# Patient Record
Sex: Female | Born: 1955
Health system: Southern US, Community
[De-identification: ages and names within clinical notes are randomized; demographics above are authoritative.]

## PROBLEM LIST (undated history)

## (undated) DIAGNOSIS — I10 Essential (primary) hypertension: Secondary | ICD-10-CM

## (undated) DIAGNOSIS — Z789 Other specified health status: Secondary | ICD-10-CM

## (undated) DIAGNOSIS — M1990 Unspecified osteoarthritis, unspecified site: Secondary | ICD-10-CM

---

## 2015-06-14 ENCOUNTER — Other Ambulatory Visit: Payer: Self-pay | Admitting: Physician Assistant

## 2015-06-21 NOTE — Patient Instructions (Addendum)
YOUR PROCEDURE IS SCHEDULED ON :  06/23/15  REPORT TO [name] MAIN ENTRANCE FOLLOW SIGNS TO EAST ELEVATOR - GO TO 3rd FLOOR CHECK IN AT 3 EAST NURSES STATION (SHORT STAY) AT: 5:30 AM  CALL THIS NUMBER IF YOU HAVE PROBLEMS THE MORNING OF SURGERY [phone]  REMEMBER:ONLY 1 PER PERSON MAY GO TO SHORT STAY WITH YOU TO GET READY THE MORNING OF YOUR SURGERY  DO NOT EAT FOOD OR DRINK LIQUIDS AFTER MIDNIGHT  TAKE THESE MEDICINES THE MORNING OF SURGERY: NONE  YOU MAY NOT HAVE ANY METAL ON YOUR BODY INCLUDING HAIR PINS AND PIERCING'S. DO NOT WEAR JEWELRY, MAKEUP, LOTIONS, POWDERS OR PERFUMES. DO NOT WEAR NAIL POLISH. DO NOT SHAVE 48 HRS PRIOR TO SURGERY. MEN MAY SHAVE FACE AND NECK.  DO NOT BRING VALUABLES TO HOSPITAL. [name] IS NOT RESPONSIBLE FOR VALUABLES.  CONTACTS, DENTURES OR PARTIALS MAY NOT BE WORN TO SURGERY. LEAVE SUITCASE IN CAR. CAN BE BROUGHT TO ROOM AFTER SURGERY.  PATIENTS DISCHARGED THE DAY OF SURGERY WILL NOT BE ALLOWED TO DRIVE HOME.  PLEASE READ OVER THE FOLLOWING INSTRUCTION SHEETS _________________________________________________________________________________                                          [name] - PREPARING FOR SURGERY  Before surgery, you can play an important role.  Because skin is not sterile, your skin needs to be as free of germs as possible.  You can reduce the number of germs on your skin by washing with CHG (chlorahexidine gluconate) soap before surgery.  CHG is an antiseptic cleaner which kills germs and bonds with the skin to continue killing germs even after washing. Please DO NOT use if you have an allergy to CHG or antibacterial soaps.  If your skin becomes reddened/irritated stop using the CHG and inform your nurse when you arrive at Short Stay. Do not shave (including legs and underarms) for at least 48 hours prior to the first CHG shower.  You may shave your face. Please follow these instructions  carefully:   1.  Shower with CHG Soap the night before surgery and the  morning of Surgery.   2.  If you choose to wash your hair, wash your hair first as usual with your  normal  Shampoo.   3.  After you shampoo, rinse your hair and body thoroughly to remove the  shampoo.                                         4.  Use CHG as you would any other liquid soap.  You can apply chg directly  to the skin and wash . Gently wash with scrungie or clean wascloth    5.  Apply the CHG Soap to your body ONLY FROM THE NECK DOWN.   Do not use on open                           Wound or open sores. Avoid contact with eyes, ears mouth and genitals (private parts).                        Genitals (private parts) with your normal soap.  6.  Wash thoroughly, paying special attention to the area where your surgery  will be performed.   7.  Thoroughly rinse your body with warm water from the neck down.   8.  DO NOT shower/wash with your normal soap after using and rinsing off  the CHG Soap .                9.  Pat yourself dry with a clean towel.             10.  Wear clean night clothes to bed after shower             11.  Place clean sheets on your bed the night of your first shower and do not  sleep with pets.  Day of Surgery : Do not apply any lotions/deodorants the morning of surgery.  Please wear clean clothes to the hospital/surgery [name].  FAILURE TO FOLLOW THESE INSTRUCTIONS MAY RESULT IN THE CANCELLATION OF YOUR SURGERY    PATIENT SIGNATURE_________________________________  ______________________________________________________________________     Nancy [name]  An incentive spirometer is a tool that can help keep your lungs clear and active. This tool measures how well you are filling your lungs with each breath. Taking long deep breaths may help reverse or decrease the chance of developing breathing (pulmonary) problems (especially infection) following:  A long  period of time when you are unable to move or be active. BEFORE THE PROCEDURE   If the spirometer includes an indicator to show your best effort, your nurse or respiratory therapist will set it to a desired goal.  If possible, sit up straight or lean slightly forward. Try not to slouch.  Hold the incentive spirometer in an upright position. INSTRUCTIONS FOR USE  1. Sit on the edge of your bed if possible, or sit up as far as you can in bed or on a chair. 2. Hold the incentive spirometer in an upright position. 3. Breathe out normally. 4. Place the mouthpiece in your mouth and seal your lips tightly around it. 5. Breathe in slowly and as deeply as possible, raising the piston or the ball toward the top of the column. 6. Hold your breath for 3-5 seconds or for as long as possible. Allow the piston or ball to fall to the bottom of the column. 7. Remove the mouthpiece from your mouth and breathe out normally. 8. Rest for a few seconds and repeat Steps 1 through 7 at least 10 times every 1-2 hours when you are awake. Take your time and take a few normal breaths between deep breaths. 9. The spirometer may include an indicator to show your best effort. Use the indicator as a goal to work toward during each repetition. 10. After each set of 10 deep breaths, practice coughing to be sure your lungs are clear. If you have an incision (the cut made at the time of surgery), support your incision when coughing by placing a pillow or rolled up towels firmly against it. Once you are able to get out of bed, walk around indoors and cough well. You may stop using the incentive spirometer when instructed by your caregiver.  RISKS AND COMPLICATIONS  Take your time so you do not get dizzy or light-headed.  If you are in pain, you may need to take or ask for pain medication before doing incentive spirometry. It is harder to take a deep breath if you are having pain. AFTER USE  Rest and breathe slowly and  easily.  It can be helpful to keep track of a log of your progress. Your caregiver can provide you with a simple table to help with this. If you are using the spirometer at home, follow these instructions: [place] IF:   You are having difficultly using the spirometer.  You have trouble using the spirometer as often as instructed.  Your pain medication is not giving enough relief while using the spirometer.  You develop fever of 100.5 F (38.1 C) or higher. SEEK IMMEDIATE MEDICAL CARE IF:   You cough up bloody sputum that had not been present before.  You develop fever of 102 F (38.9 C) or greater.  You develop worsening pain at or near the incision site. MAKE SURE YOU:   Understand these instructions.  Will watch your condition.  Will get help right away if you are not doing well or get worse. Document Released: 08/05/2006 Document Revised: 06/17/2011 Document Reviewed: 10/06/2006 [facility] Patient Information 2014 [place], [place].   ________________________________________________________________________

## 2015-06-22 ENCOUNTER — Encounter (HOSPITAL_COMMUNITY): Payer: Self-pay

## 2015-06-22 ENCOUNTER — Encounter (HOSPITAL_COMMUNITY)
Admission: RE | Admit: 2015-06-22 | Discharge: 2015-06-22 | Disposition: A | Discharge status: Home / Self Care | Payer: BLUE CROSS/BLUE SHIELD | Source: Ambulatory Visit | Attending: Orthopaedic Surgery | Admitting: Orthopaedic Surgery

## 2015-06-22 HISTORY — DX: Unspecified osteoarthritis, unspecified site: M19.90

## 2015-06-22 LAB — PROTIME-INR
INR: 1.12 (ref 0.00–1.49)
PROTHROMBIN TIME: 14.6 s (ref 11.6–15.2)

## 2015-06-22 LAB — CBC
HEMATOCRIT: 44.5 % (ref 36.0–46.0)
Hemoglobin: 14.3 g/dL (ref 12.0–15.0)
MCH: 29.5 pg (ref 26.0–34.0)
MCHC: 32.1 g/dL (ref 30.0–36.0)
MCV: 91.9 fL (ref 78.0–100.0)
PLATELETS: 264 10*3/uL (ref 150–400)
RBC: 4.84 MIL/uL (ref 3.87–5.11)
RDW: 13.8 % (ref 11.5–15.5)
WBC: 8.9 10*3/uL (ref 4.0–10.5)

## 2015-06-22 LAB — BASIC METABOLIC PANEL
Anion gap: 12 (ref 5–15)
BUN: 15 mg/dL (ref 6–20)
CALCIUM: 9.3 mg/dL (ref 8.9–10.3)
CO2: 24 mmol/L (ref 22–32)
CREATININE: 0.78 mg/dL (ref 0.44–1.00)
Chloride: 106 mmol/L (ref 101–111)
GFR calc non Af Amer: >60 mL/min (ref >60)
Glucose, Bld: 107 mg/dL — ABNORMAL HIGH (ref 65–99)
Potassium: 4.1 mmol/L (ref 3.5–5.1)
SODIUM: 142 mmol/L (ref 135–145)

## 2015-06-22 LAB — SURGICAL PCR SCREEN
MRSA, PCR: NEGATIVE
STAPHYLOCOCCUS AUREUS: POSITIVE — AB

## 2015-06-22 LAB — ABO/RH: ABO/RH(D): O POS

## 2015-06-22 NOTE — Anesthesia Preprocedure Evaluation (Signed)
Anesthesia Evaluation  Patient identified by MRN, date of birth, ID band Patient awake    Reviewed: Allergy & Precautions, NPO status , Patient's Chart, lab work & pertinent test results  History of Anesthesia Complications Negative for: history of anesthetic complications  Airway Mallampati: III  TM Distance: >3 FB Neck ROM: Full    Dental no notable dental hx. (+) Dental Advisory Given   Pulmonary neg pulmonary ROS,    Pulmonary exam normal breath sounds clear to auscultation       Cardiovascular negative cardio ROS Normal cardiovascular exam Rhythm:Regular Rate:Normal     Neuro/Psych negative neurological ROS  negative psych ROS   GI/Hepatic negative GI ROS, Neg liver ROS,   Endo/Other  Morbid obesity  Renal/GU negative Renal ROS  negative genitourinary   Musculoskeletal  (+) Arthritis ,   Abdominal (+) + obese,   Peds negative pediatric ROS (+)  Hematology negative hematology ROS (+)   Anesthesia Other Findings   Reproductive/Obstetrics negative OB ROS                             Anesthesia Physical Anesthesia Plan  ASA: III  Anesthesia Plan: Spinal   Post-op Pain Management:    Induction:   Airway Management Planned:   Additional Equipment:   Intra-op Plan:   Post-operative Plan:   Informed Consent: I have reviewed the patients History and Physical, chart, labs and discussed the procedure including the risks, benefits and alternatives for the proposed anesthesia with the patient or authorized representative who has indicated his/her understanding and acceptance.   Dental advisory given  Plan Discussed with: CRNA  Anesthesia Plan Comments:         Anesthesia Quick Evaluation

## 2015-06-23 ENCOUNTER — Inpatient Hospital Stay (HOSPITAL_COMMUNITY)
Admission: RE | Admit: 2015-06-23 | Discharge: 2015-06-24 | DRG: 470 | Disposition: A | Discharge status: Home Health | Payer: BLUE CROSS/BLUE SHIELD | Source: Ambulatory Visit | Attending: Orthopaedic Surgery | Admitting: Orthopaedic Surgery

## 2015-06-23 ENCOUNTER — Inpatient Hospital Stay (HOSPITAL_COMMUNITY): Payer: BLUE CROSS/BLUE SHIELD

## 2015-06-23 ENCOUNTER — Encounter (HOSPITAL_COMMUNITY)
Admission: RE | Disposition: A | Discharge status: Home Health | Payer: Self-pay | Source: Ambulatory Visit | Attending: Orthopaedic Surgery

## 2015-06-23 ENCOUNTER — Inpatient Hospital Stay (HOSPITAL_COMMUNITY): Payer: BLUE CROSS/BLUE SHIELD | Admitting: Anesthesiology

## 2015-06-23 ENCOUNTER — Encounter (HOSPITAL_COMMUNITY): Payer: Self-pay | Admitting: *Deleted

## 2015-06-23 DIAGNOSIS — Z6841 Body Mass Index (BMI) 40.0 and over, adult: Secondary | ICD-10-CM | POA: Diagnosis not present

## 2015-06-23 DIAGNOSIS — Z96642 Presence of left artificial hip joint: Secondary | ICD-10-CM

## 2015-06-23 DIAGNOSIS — M1612 Unilateral primary osteoarthritis, left hip: Principal | ICD-10-CM

## 2015-06-23 DIAGNOSIS — Z01812 Encounter for preprocedural laboratory examination: Secondary | ICD-10-CM | POA: Diagnosis not present

## 2015-06-23 DIAGNOSIS — Z419 Encounter for procedure for purposes other than remedying health state, unspecified: Secondary | ICD-10-CM

## 2015-06-23 DIAGNOSIS — M25552 Pain in left hip: Secondary | ICD-10-CM | POA: Diagnosis present

## 2015-06-23 HISTORY — PX: TOTAL HIP ARTHROPLASTY: SHX124

## 2015-06-23 LAB — TYPE AND SCREEN
ABO/RH(D): O POS
ANTIBODY SCREEN: NEGATIVE

## 2015-06-23 SURGERY — ARTHROPLASTY, HIP, TOTAL, ANTERIOR APPROACH
Anesthesia: Spinal | Site: Hip | Laterality: Left

## 2015-06-23 MED ORDER — FENTANYL CITRATE (PF) 100 MCG/2ML IJ SOLN
INTRAMUSCULAR | Status: DC | PRN
  Administered 2015-06-23: 100 ug via INTRAVENOUS

## 2015-06-23 MED ORDER — PHENYLEPHRINE HCL 10 MG/ML IJ SOLN
INTRAMUSCULAR | Status: DC | PRN
  Administered 2015-06-23 (×2): 40 ug via INTRAVENOUS

## 2015-06-23 MED ORDER — TRANEXAMIC ACID 1000 MG/10ML IV SOLN
1000.0000 mg | INTRAVENOUS | Status: AC
  Administered 2015-06-23: 1000 mg via INTRAVENOUS
  Filled 2015-06-23: qty 10

## 2015-06-23 MED ORDER — MIDAZOLAM HCL 5 MG/5ML IJ SOLN
INTRAMUSCULAR | Status: DC | PRN
  Administered 2015-06-23 (×2): 1 mg via INTRAVENOUS

## 2015-06-23 MED ORDER — CEFAZOLIN SODIUM-DEXTROSE 2-3 GM-% IV SOLR
INTRAVENOUS | Status: AC
  Filled 2015-06-23: qty 50

## 2015-06-23 MED ORDER — METOCLOPRAMIDE HCL 5 MG/ML IJ SOLN: 5.0000 mg | Freq: Three times a day (TID) | INTRAMUSCULAR | Status: DC | PRN

## 2015-06-23 MED ORDER — LACTATED RINGERS IV SOLN: INTRAVENOUS | Status: DC

## 2015-06-23 MED ORDER — METHOCARBAMOL 500 MG PO TABS
500.0000 mg | ORAL_TABLET | Freq: Four times a day (QID) | ORAL | Status: DC | PRN
  Administered 2015-06-23: 500 mg via ORAL
  Filled 2015-06-23: qty 1

## 2015-06-23 MED ORDER — LACTATED RINGERS IV SOLN
INTRAVENOUS | Status: DC | PRN
  Administered 2015-06-23 (×2): via INTRAVENOUS

## 2015-06-23 MED ORDER — PROPOFOL 500 MG/50ML IV EMUL
INTRAVENOUS | Status: DC | PRN
  Administered 2015-06-23: 100 ug/kg/min via INTRAVENOUS

## 2015-06-23 MED ORDER — DIPHENHYDRAMINE HCL 12.5 MG/5ML PO ELIX: 12.5000 mg | ORAL_SOLUTION | ORAL | Status: DC | PRN

## 2015-06-23 MED ORDER — SODIUM CHLORIDE 0.9 % IR SOLN
Status: DC | PRN
Start: 2015-06-23 — End: 2015-06-23
  Administered 2015-06-23: 1000 mL

## 2015-06-23 MED ORDER — MIDAZOLAM HCL 2 MG/2ML IJ SOLN
INTRAMUSCULAR | Status: AC
  Filled 2015-06-23: qty 2

## 2015-06-23 MED ORDER — SODIUM CHLORIDE 0.9 % IV SOLN
INTRAVENOUS | Status: DC
  Administered 2015-06-23: 12:00:00 via INTRAVENOUS

## 2015-06-23 MED ORDER — OXYCODONE HCL 5 MG PO TABS: 5.0000 mg | ORAL_TABLET | ORAL | Status: DC | PRN

## 2015-06-23 MED ORDER — ACETAMINOPHEN 650 MG RE SUPP: 650.0000 mg | Freq: Four times a day (QID) | RECTAL | Status: DC | PRN

## 2015-06-23 MED ORDER — PROPOFOL 10 MG/ML IV BOLUS
INTRAVENOUS | Status: AC
  Filled 2015-06-23: qty 20

## 2015-06-23 MED ORDER — BUPIVACAINE HCL (PF) 0.5 % IJ SOLN
INTRAMUSCULAR | Status: DC | PRN
  Administered 2015-06-23: 3 mL

## 2015-06-23 MED ORDER — CEFAZOLIN SODIUM-DEXTROSE 2-3 GM-% IV SOLR
2.0000 g | INTRAVENOUS | Status: AC
  Administered 2015-06-23: 2000 mg via INTRAVENOUS

## 2015-06-23 MED ORDER — DEXAMETHASONE SODIUM PHOSPHATE 10 MG/ML IJ SOLN
INTRAMUSCULAR | Status: AC
  Filled 2015-06-23: qty 1

## 2015-06-23 MED ORDER — METHOCARBAMOL 1000 MG/10ML IJ SOLN
500.0000 mg | Freq: Four times a day (QID) | INTRAVENOUS | Status: DC | PRN
  Administered 2015-06-23: 500 mg via INTRAVENOUS
  Filled 2015-06-23 (×2): qty 5

## 2015-06-23 MED ORDER — MENTHOL 3 MG MT LOZG: 1.0000 | LOZENGE | OROMUCOSAL | Status: DC | PRN

## 2015-06-23 MED ORDER — CEFAZOLIN SODIUM-DEXTROSE 2-3 GM-% IV SOLR
2.0000 g | Freq: Four times a day (QID) | INTRAVENOUS | Status: AC
  Administered 2015-06-23 (×2): 2 g via INTRAVENOUS
  Filled 2015-06-23 (×2): qty 50

## 2015-06-23 MED ORDER — FENTANYL CITRATE (PF) 100 MCG/2ML IJ SOLN
INTRAMUSCULAR | Status: AC
  Filled 2015-06-23: qty 2

## 2015-06-23 MED ORDER — ONDANSETRON HCL 4 MG/2ML IJ SOLN
INTRAMUSCULAR | Status: AC
  Filled 2015-06-23: qty 2

## 2015-06-23 MED ORDER — METOCLOPRAMIDE HCL 10 MG PO TABS: 5.0000 mg | ORAL_TABLET | Freq: Three times a day (TID) | ORAL | Status: DC | PRN

## 2015-06-23 MED ORDER — LIDOCAINE HCL (CARDIAC) 20 MG/ML IV SOLN
INTRAVENOUS | Status: DC | PRN
  Administered 2015-06-23: 50 mg via INTRAVENOUS

## 2015-06-23 MED ORDER — ACETAMINOPHEN 325 MG PO TABS: 650.0000 mg | ORAL_TABLET | Freq: Four times a day (QID) | ORAL | Status: DC | PRN

## 2015-06-23 MED ORDER — KETOROLAC TROMETHAMINE 15 MG/ML IJ SOLN
7.5000 mg | Freq: Four times a day (QID) | INTRAMUSCULAR | Status: DC
  Administered 2015-06-23 (×2): 7.5 mg via INTRAVENOUS
  Filled 2015-06-23 (×3): qty 1

## 2015-06-23 MED ORDER — ASPIRIN EC 325 MG PO TBEC
325.0000 mg | DELAYED_RELEASE_TABLET | Freq: Two times a day (BID) | ORAL | Status: DC
  Administered 2015-06-24: 325 mg via ORAL
  Filled 2015-06-23 (×3): qty 1

## 2015-06-23 MED ORDER — PHENOL 1.4 % MT LIQD: 1.0000 | OROMUCOSAL | Status: DC | PRN

## 2015-06-23 MED ORDER — ONDANSETRON HCL 4 MG/2ML IJ SOLN: 4.0000 mg | Freq: Four times a day (QID) | INTRAMUSCULAR | Status: DC | PRN

## 2015-06-23 MED ORDER — ZOLPIDEM TARTRATE 5 MG PO TABS: 5.0000 mg | ORAL_TABLET | Freq: Every evening | ORAL | Status: DC | PRN

## 2015-06-23 MED ORDER — DEXAMETHASONE SODIUM PHOSPHATE 10 MG/ML IJ SOLN
INTRAMUSCULAR | Status: DC | PRN
  Administered 2015-06-23: 10 mg via INTRAVENOUS

## 2015-06-23 MED ORDER — HYDROMORPHONE HCL 1 MG/ML IJ SOLN: 1.0000 mg | INTRAMUSCULAR | Status: DC | PRN

## 2015-06-23 MED ORDER — ALUM & MAG HYDROXIDE-SIMETH 200-200-20 MG/5ML PO SUSP: 30.0000 mL | ORAL | Status: DC | PRN

## 2015-06-23 MED ORDER — BUPIVACAINE HCL (PF) 0.5 % IJ SOLN
INTRAMUSCULAR | Status: AC
  Filled 2015-06-23: qty 30

## 2015-06-23 MED ORDER — LIDOCAINE HCL (CARDIAC) 20 MG/ML IV SOLN
INTRAVENOUS | Status: AC
  Filled 2015-06-23: qty 5

## 2015-06-23 MED ORDER — FENTANYL CITRATE (PF) 100 MCG/2ML IJ SOLN
25.0000 ug | INTRAMUSCULAR | Status: DC | PRN
  Administered 2015-06-23: 50 ug via INTRAVENOUS

## 2015-06-23 MED ORDER — CHLORHEXIDINE GLUCONATE 4 % EX LIQD: 60.0000 mL | Freq: Once | CUTANEOUS | Status: DC

## 2015-06-23 MED ORDER — 0.9 % SODIUM CHLORIDE (POUR BTL) OPTIME
TOPICAL | Status: DC | PRN
  Administered 2015-06-23: 1000 mL

## 2015-06-23 MED ORDER — DOCUSATE SODIUM 100 MG PO CAPS: 100.0000 mg | ORAL_CAPSULE | Freq: Two times a day (BID) | ORAL | Status: DC

## 2015-06-23 MED ORDER — ONDANSETRON HCL 4 MG PO TABS: 4.0000 mg | ORAL_TABLET | Freq: Four times a day (QID) | ORAL | Status: DC | PRN

## 2015-06-23 MED ORDER — ONDANSETRON HCL 4 MG/2ML IJ SOLN: 4.0000 mg | Freq: Once | INTRAMUSCULAR | Status: DC | PRN

## 2015-06-23 SURGICAL SUPPLY — 32 items
BAG ZIPLOCK 12X15 (MISCELLANEOUS) IMPLANT
BLADE SAW SGTL 18X1.27X75 (BLADE) ×2 IMPLANT
CAPT HIP TOTAL 2 ×2 IMPLANT
CELLS DAT CNTRL 66122 CELL SVR (MISCELLANEOUS) ×1 IMPLANT
CLOTH BEACON ORANGE TIMEOUT ST (SAFETY) ×2 IMPLANT
DRAPE STERI IOBAN 125X83 (DRAPES) ×2 IMPLANT
DRAPE U-SHAPE 47X51 STRL (DRAPES) ×4 IMPLANT
DRSG AQUACEL AG ADV 3.5X10 (GAUZE/BANDAGES/DRESSINGS) ×2 IMPLANT
DURAPREP 26ML APPLICATOR (WOUND CARE) ×2 IMPLANT
ELECT REM PT RETURN 9FT ADLT (ELECTROSURGICAL) ×2
ELECTRODE REM PT RTRN 9FT ADLT (ELECTROSURGICAL) ×1 IMPLANT
GAUZE XEROFORM 1X8 LF (GAUZE/BANDAGES/DRESSINGS) ×2 IMPLANT
GLOVE BIO SURGEON STRL SZ7.5 (GLOVE) ×2 IMPLANT
GLOVE BIOGEL PI IND STRL 8 (GLOVE) ×2 IMPLANT
GLOVE BIOGEL PI INDICATOR 8 (GLOVE) ×2
GLOVE ECLIPSE 8.0 STRL XLNG CF (GLOVE) ×2 IMPLANT
GOWN STRL REUS W/TWL XL LVL3 (GOWN DISPOSABLE) ×4 IMPLANT
HANDPIECE INTERPULSE COAX TIP (DISPOSABLE) ×1
HOLDER FOLEY CATH W/STRAP (MISCELLANEOUS) ×2 IMPLANT
PACK ANTERIOR HIP CUSTOM (KITS) ×2 IMPLANT
RTRCTR WOUND ALEXIS 18CM MED (MISCELLANEOUS) ×2
SET HNDPC FAN SPRY TIP SCT (DISPOSABLE) ×1 IMPLANT
STAPLER VISISTAT 35W (STAPLE) ×2 IMPLANT
STRIP CLOSURE SKIN 1/2X4 (GAUZE/BANDAGES/DRESSINGS) ×2 IMPLANT
SUT ETHIBOND NAB CT1 #1 30IN (SUTURE) ×2 IMPLANT
SUT MNCRL AB 4-0 PS2 18 (SUTURE) IMPLANT
SUT VIC AB 0 CT1 36 (SUTURE) ×2 IMPLANT
SUT VIC AB 1 CT1 36 (SUTURE) ×2 IMPLANT
SUT VIC AB 2-0 CT1 27 (SUTURE) ×2
SUT VIC AB 2-0 CT1 TAPERPNT 27 (SUTURE) ×2 IMPLANT
TRAY FOLEY W/METER SILVER 14FR (SET/KITS/TRAYS/PACK) ×2 IMPLANT
TRAY FOLEY W/METER SILVER 16FR (SET/KITS/TRAYS/PACK) IMPLANT

## 2015-06-23 NOTE — Progress Notes (Signed)
X-ray results noted 

## 2015-06-23 NOTE — Transfer of Care (Signed)
Immediate Anesthesia Transfer of Care Note  Patient: Nancy [name]  Procedure(s) Performed: Procedure(s): LEFT TOTAL HIP ARTHROPLASTY ANTERIOR APPROACH (Left)  Patient Location: PACU  Anesthesia Type:Spinal  Level of Consciousness: awake, alert  and oriented  Airway & Oxygen Therapy: Patient Spontanous Breathing and Patient connected to face mask oxygen  Post-op Assessment: Report given to RN and Post -op Vital signs reviewed and stable  Post vital signs: Reviewed and stable  Last Vitals:  Filed Vitals:   06/23/15 0601  BP: 186/92    Complications: No apparent anesthesia complications

## 2015-06-23 NOTE — Evaluation (Signed)
Physical Therapy Evaluation Patient Details Name: Nancy ColeSheila Bryson-Eckroade MRN: [id number] DOB: [birth year] Today's Date: 06/23/2015   History of Present Illness  L THR  Clinical Impression  Pt s/p L THR presents with decreased L LE strength/ROM and post op pain limiting functional mobility.  Pt should progress well to dc home with assist of family/friends.  Pt declines HHPT stating she has many nurses in family to assist.    Follow Up Recommendations No PT follow up    Equipment Recommendations  None recommended by PT    Recommendations for Other Services OT consult     Precautions / Restrictions Precautions Precautions: Fall Restrictions Weight Bearing Restrictions: No Other Position/Activity Restrictions: WBAT      Mobility  Bed Mobility Overal bed mobility: Needs Assistance Bed Mobility: Supine to Sit     Supine to sit: Min assist     General bed mobility comments: cues for sequence and use of R LE to self assist.  Pt with limited use of R UE 2* location of IV  Transfers Overall transfer level: Needs assistance Equipment used: Rolling walker (2 wheeled) Transfers: Sit to/from Stand Sit to Stand: Min guard         General transfer comment: cues for LE management and use of UEs to self assist  Ambulation/Gait Ambulation/Gait assistance: Min assist;Min guard Ambulation Distance (Feet): 50 Feet Assistive device: Rolling walker (2 wheeled) Gait Pattern/deviations: Step-to pattern;Decreased step length - right;Decreased step length - left;Shuffle;Trunk flexed Gait velocity: decr   General Gait Details: cues for posture, position from RW and initial sequence  Stairs            Wheelchair Mobility    Modified Rankin (Stroke Patients Only)       Balance                                             Pertinent Vitals/Pain Pain Assessment: 0-10 Pain Score: 5  Pain Location: L hip Pain Descriptors / Indicators: Aching;Burning Pain  Intervention(s): Limited activity within patient's tolerance;Monitored during session;Ice applied    Home Living Family/patient expects to be discharged to:: Private residence Living Arrangements: Spouse/significant other Available Help at Discharge: Family Type of Home: House Home Access: Stairs to enter Entrance Stairs-Rails: Can reach both Entrance Stairs-Number of Steps: 4 Home Layout: One level Home Equipment: Walker - 2 wheels;Cane - single point Additional Comments: Pt is clarifying RW dimensions    Prior Function Level of Independence: Independent with assistive device(s);Independent         Comments: cane as needed     Hand Dominance        Extremity/Trunk Assessment   Upper Extremity Assessment: Overall WFL for tasks assessed           Lower Extremity Assessment: LLE deficits/detail   LLE Deficits / Details: Strength at hip 3-/5 with AAROM at hip to 90 flex and 20 abd  Cervical / Trunk Assessment: Normal  Communication   Communication: No difficulties  Cognition Arousal/Alertness: Awake/alert Behavior During Therapy: WFL for tasks assessed/performed Overall Cognitive Status: Within Functional Limits for tasks assessed                      General Comments      Exercises Total Joint Exercises Ankle Circles/Pumps: AAROM;Both;15 reps;Supine Heel Slides: AAROM;Left;15 reps;Supine Hip ABduction/ADduction: AAROM;Left;15 reps;Supine      Assessment/Plan  PT Assessment Patient needs continued PT services  PT Diagnosis Difficulty walking   PT Problem List Decreased strength;Decreased range of motion;Decreased activity tolerance;Decreased mobility;Decreased knowledge of use of DME;Pain;Obesity  PT Treatment Interventions DME instruction;Gait training;Stair training;Functional mobility training;Therapeutic activities;Therapeutic exercise;Patient/family education   PT Goals (Current goals can be found in the Care Plan section) Acute Rehab PT  Goals Patient Stated Goal: Regain IND and walk without pain PT Goal Formulation: With patient Time For Goal Achievement: 06/26/15 Potential to Achieve Goals: Good    Frequency 7X/week   Barriers to discharge        Co-evaluation               End of Session   Activity Tolerance: Patient tolerated treatment well Patient left: in chair;with call bell/phone within reach;with chair alarm set;with family/visitor present Nurse Communication: Mobility status         Time: 1540-1609 PT Time Calculation (min) (ACUTE ONLY): 29 min   Charges:   PT Evaluation $PT Eval Low Complexity: 1 Procedure PT Treatments $Gait Training: 8-22 mins   PT G Codes:        Jobeth Pangilinan 06/26/15, 5:54 PM

## 2015-06-23 NOTE — Anesthesia Procedure Notes (Signed)
Spinal Patient location during procedure: OR End time: 06/23/2015 7:28 AM Staffing Resident/CRNA: WALKER, KAREN L Performed by: anesthesiologist and resident/CRNA  Preanesthetic Checklist Completed: patient identified, site marked, surgical consent, pre-op evaluation, timeout performed, IV checked, risks and benefits discussed and monitors and equipment checked Spinal Block Patient position: sitting Prep: Betadine Patient monitoring: heart rate, continuous pulse ox and blood pressure Injection technique: single-shot Needle Needle type: Sprotte  Needle gauge: 24 G Needle length: 9 cm Assessment Sensory level: T6 Additional Notes Expiration date of kit checked and confirmed. Patient tolerated procedure well, without complications.     

## 2015-06-23 NOTE — H&P (Signed)
TOTAL HIP ADMISSION H&P  Patient is admitted for left total hip arthroplasty.  Subjective:  Chief Complaint: left hip pain  HPI: [name], 60 y.o. female, has a history of pain and functional disability in the left hip(s) due to arthritis and patient has failed non-surgical conservative treatments for greater than 12 weeks to include NSAID's and/or analgesics, flexibility and strengthening excercises, use of assistive devices, weight reduction as appropriate and activity modification.  Onset of symptoms was gradual starting 1 years ago with gradually worsening course since that time.The patient noted no past surgery on the left hip(s).  Patient currently rates pain in the left hip at 10 out of 10 with activity. Patient has night pain, worsening of pain with activity and weight bearing, pain that interfers with activities of daily living and pain with passive range of motion. Patient has evidence of subchondral cysts, subchondral sclerosis, periarticular osteophytes and joint space narrowing by imaging studies. This condition presents safety issues increasing the risk of falls.  There is no current active infection.  Patient Active Problem List   Diagnosis Date Noted  . Osteoarthritis of left hip 06/23/2015   Past Medical History  Diagnosis Date  . Arthritis     Past Surgical History  Procedure Laterality Date  . No past surgeries      No prescriptions prior to admission   Not on File  Social History  Substance Use Topics  . Smoking status: Never Smoker   . Smokeless tobacco: Not on file  . Alcohol Use: Yes     Comment: very rare    History reviewed. No pertinent family history.   Review of Systems  Musculoskeletal: Positive for joint pain.  All other systems reviewed and are negative.   Objective:  Physical Exam  Constitutional: She is oriented to person, place, and time. She appears well-developed and well-nourished.  HENT:  Head: Normocephalic and  atraumatic.  Eyes: EOM are normal. Pupils are equal, round, and reactive to light.  Neck: Normal range of motion. Neck supple.  Cardiovascular: Normal rate and regular rhythm.   Respiratory: Effort normal and breath sounds normal.  GI: Soft. Bowel sounds are normal.  Neurological: She is alert and oriented to person, place, and time.  Skin: Skin is warm and dry.  Psychiatric: She has a normal mood and affect.    Vital signs in last 24 hours: Temp:  [98.6 F (37 C)] 98.6 F (37 C) (03/16 0918) Pulse Rate:  [112] 112 (03/16 0918) Resp:  [18] 18 (03/16 0918) BP: (186)/(92-95) 186/92 mmHg (03/17 0601) SpO2:  [100 %] 100 % (03/16 0918) Weight:  [117.028 kg (258 lb)] 117.028 kg (258 lb) (03/17 0533)  Labs:   Estimated body mass index is 41.66 kg/(m^2) as calculated from the following:   Height as of this encounter: 5\' 6"  (1.676 m).   Weight as of this encounter: 117.028 kg (258 lb).   Imaging Review Plain radiographs demonstrate severe degenerative joint disease of the left hip(s). The bone quality appears to be good for age and reported activity level.  Assessment/Plan:  End stage arthritis, left hip(s)  The patient history, physical examination, clinical judgement of the provider and imaging studies are consistent with end stage degenerative joint disease of the left hip(s) and total hip arthroplasty is deemed medically necessary. The treatment options including medical management, injection therapy, arthroscopy and arthroplasty were discussed at length. The risks and benefits of total hip arthroplasty were presented and reviewed. The risks due to aseptic loosening,  infection, stiffness, dislocation/subluxation,  thromboembolic complications and other imponderables were discussed.  The patient acknowledged the explanation, agreed to proceed with the plan and consent was signed. Patient is being admitted for inpatient treatment for surgery, pain control, PT, OT, prophylactic  antibiotics, VTE prophylaxis, progressive ambulation and ADL's and discharge planning.The patient is planning to be discharged home with home health services

## 2015-06-23 NOTE — Anesthesia Postprocedure Evaluation (Signed)
Anesthesia Post Note  Patient: Nancy [name]  Procedure(s) Performed: Procedure(s) (LRB): LEFT TOTAL HIP ARTHROPLASTY ANTERIOR APPROACH (Left)  Patient location during evaluation: PACU Anesthesia Type: Spinal Level of consciousness: awake and alert Pain management: pain level controlled Vital Signs Assessment: post-procedure vital signs reviewed and stable Respiratory status: spontaneous breathing, nonlabored ventilation, respiratory function stable and patient connected to nasal cannula oxygen Cardiovascular status: blood pressure returned to baseline and stable Postop Assessment: no signs of nausea or vomiting and spinal receding Anesthetic complications: no    Last Vitals:  Filed Vitals:   06/23/15 1154 06/23/15 1255  BP: 174/90 175/89  Pulse: 103 109  Temp: 37 C 36.8 C  Resp: 12 12    Last Pain:  Filed Vitals:   06/23/15 1256  PainSc: 0-No pain                 Gean Laursen JENNETTE

## 2015-06-23 NOTE — Op Note (Signed)
NAMMarland Kitchen:  Nancy [name], Nancy      ACCOUNT NO.:  [id number]  MEDICAL RECORD NO.:  [id number]  LOCATION:  [facility]                         [facility]  PHYSICIAN:  Vanita PandaChristopher Y. Magnus IvanBlackman, M.D.DATE OF BIRTH:  March 29, 1956  DATE OF PROCEDURE:  06/23/2015 DATE OF DISCHARGE:                              OPERATIVE REPORT   PREOPERATIVE DIAGNOSIS:  Severe primary osteoarthritis and degenerative joint disease, left hip.  POSTOPERATIVE DIAGNOSIS:  Severe primary osteoarthritis and degenerative joint disease, left hip.  PROCEDURE:  Left total hip arthroplasty through direct anterior approach.  IMPLANTS:  DePuy Sector Gription acetabular component size 50, size 32+ 0 neutral polyethylene liner, size 11 Corail femoral component with standard offset, size 32+ 1 ceramic hip ball.  SURGEON:  Vanita PandaChristopher Y. Magnus IvanBlackman, MD  ASSISTANT:  Richardean CanalGilbert Clark, PA-C  ANESTHESIA:  Spinal.  ANTIBIOTICS:  3 g IV Ancef.  BLOOD LOSS:  Less than 200 mL.  COMPLICATIONS:  None.  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  She is morbidly obese and has severe osteoarthritis of both knees, but her left hip is what is the worse for her.  Radiographic evidence of her left hip shows flattening of femoral head.  There is actually even some subluxation in the joint.  Her pain is daily, it is 10/10.  It has detrimentally affected her activities of daily living, her mobility, and her quality of life.  Due to the point that she does wish to proceed with a total hip arthroplasty, I talked to her about having this done through direct anterior approach.  I explained in detail the risk of acute blood loss anemia, nerve and vessel injury, fracture, infection, dislocation, DVT and wound problems with this approach.  She understands our goals are decreased pain, improved mobility, and overall improved quality of life.  PROCEDURE DESCRIPTION:   After informed consent was obtained, appropriate left hip was marked.  She was brought to the operating room where spinal anesthesia was obtained while she was on her stretcher.  She was laid in a supine position.  A Foley catheter was placed and then both feet had traction boots applied to them.  Next, she was placed supine on the Hana fracture table.  The perineal post in place and both legs in inline skeletal traction devices, but no traction applied.  The left operative hip was prepped and draped with DuraPrep and sterile drapes.  A time-out was called and she was identified as correct patient, correct left hip. We then made an incision inferior and posterior to the anterior superior iliac spine and carried this obliquely down the leg.  We dissected down the tensor fascia lata muscle and tensor fascia was identified and divided longitudinally, so we could proceed with a direct anterior approach to the hip.  We identified and cauterized the lateral femoral circumflex vessels and then we were able to identify the hip capsule.  I placed a Cobra retractor around the medial and lateral femoral neck and then opened up the hip capsule in an L-type format exposing a large joint effusion and significant arthritis of her left hip.  We placed Cobra retractors within joint capsule and then made our femoral  neck cut proximal to lesser trochanter using osteotome and an oscillating saw. We placed a corkscrew guide in the femoral head and removed the femoral head in its entirety and found it to be completely deformed, flattened, and devoid of cartilage with cystic and sclerotic changes as well.  We passed this off the back table.  I then cleaned the acetabular remnants of the acetabular labrum and other debris.  I placed a bent Hohmann over the medial acetabular rim and then began reaming under direct visualization from a size 42 reamer up to a size 50.  With again all reamers under direct  visualization, the last reamer under direct fluoroscopy, so I could obtain my depth of reaming, our inclination, and anteversion.  Once I was pleased with this, I placed the real DePuy Sector Gription acetabular component size 50 and a 32+ 0 polyethylene liner for that size acetabular component.  Attention was then turned to the femur.  With the leg externally rotated to 110 degrees extended an abducted, I was able to place a Mueller retractor medially and a Hohmann retractor behind the greater trochanter.  I released the lateral joint capsule and used a box cutting osteotome in the inner femoral canal and a rongeur to lateralize and then began broaching from a size 8 broached up to a size 11.  With the size 11, we trialed a standard offset femoral neck and a 32+ 1 hip ball.  We brought the leg back over and up and with traction and internal rotation, reducing the pelvis and we were pleased with stability, leg length, and offset.  We then dislocated the hip and removed the trial components.  We were able to then place the real Corail femoral component size 11 with standard offset and a real 32+ 1 ceramic hip ball and reduced this in the pelvis again.  We then irrigated the soft tissue with normal saline solution and we closed the joint capsule with interrupted #1 Ethibond suture followed by 0 Vicryl in the deep tissue, 2-0 Vicryl in subcutaneous tissue, and staples on the skin.  Xeroform and an Aquacel dressing was applied.  She was taken off the Hana table, and taken to the recovery room in stable condition. All final counts were correct.  There were no complications noted.  Of note, Richardean Canal, PA-C assisted in the entire case.  His assistance was crucial for facilitating all aspects of this case.     Vanita Panda. Magnus Ivan, M.D.     CYB/MEDQ  D:  06/23/2015  T:  06/23/2015  Job:  [id number]

## 2015-06-23 NOTE — Progress Notes (Signed)
Portable AP Pelvis and Lateral Left Hip X-rays done. 

## 2015-06-23 NOTE — Care Management Note (Signed)
Case Management Note  Patient Details  Name: Nancy [name] MRN: [id number] Date of Birth: September 22, 1955  Subjective/Objective:      Left total hip arthroplasty               Action/Plan: Discharge Planning: NCM spoke pt and husband, Nadine CountsBob at bedside. Pt states she is [employer] fund[employer] and understands the importance of safety post surgery. She will be in [place] for a week and then travel back to [place]. Pt requesting crutches for home. Contacted Ortho Dept. Has bedside commode she can use at the location she is staying. Will contact [facility] to make aware.    Expected Discharge Date:  06/24/2015              Expected Discharge Plan:  Home w Home Health Services  In-House Referral:  NA  Discharge planning Services  CM Consult  Post Acute Care Choice:  NA Choice offered to:  NA  DME Arranged:  N/A DME Agency:  NA  HH Arranged:  Patient Refused HH Agency:  NA  Status of Service:  Completed, signed off  Medicare Important Message Given:    Date Medicare IM Given:    Medicare IM give by:    Date Additional Medicare IM Given:    Additional Medicare Important Message give by:     If discussed at Long Length of Stay Meetings, dates discussed:    Additional Comments:  Elliot CousinShavis, Lilia Letterman Ellen, RN 06/23/2015, 12:08 PM

## 2015-06-23 NOTE — Brief Op Note (Signed)
06/23/2015  8:47 AM  PATIENT:  Adalberto ColeSheila Bryson-Eckroade  60 y.o. female  PRE-OPERATIVE DIAGNOSIS:  Severe osteoarthritis left hip  POST-OPERATIVE DIAGNOSIS:  Severe osteoarthritis left hip  PROCEDURE:  Procedure(s): LEFT TOTAL HIP ARTHROPLASTY ANTERIOR APPROACH (Left)  SURGEON:  Surgeon(s) and Role:    * Kathryne Hitchhristopher Y Lorenz Donley, MD - Primary  PHYSICIAN ASSISTANT: Rexene EdisonGil Clark, PA-C  ANESTHESIA:   spinal  EBL:  Total I/O In: 1000 [I.V.:1000] Out: 350 [Urine:200; Blood:150]  COUNTS:  YES  TOURNIQUET:  * No tourniquets in log *  DICTATION: .Other Dictation: Dictation Number [id number]  PLAN OF CARE: Admit to inpatient   PATIENT DISPOSITION:  PACU - hemodynamically stable.   Delay start of Pharmacological VTE agent (>24hrs) due to surgical blood loss or risk of bleeding: no

## 2015-06-24 LAB — BASIC METABOLIC PANEL
Anion gap: 8 (ref 5–15)
BUN: 16 mg/dL (ref 6–20)
CALCIUM: 8.9 mg/dL (ref 8.9–10.3)
CO2: 24 mmol/L (ref 22–32)
CREATININE: 0.8 mg/dL (ref 0.44–1.00)
Chloride: 107 mmol/L (ref 101–111)
GFR calc Af Amer: >60 mL/min (ref >60)
GFR calc non Af Amer: >60 mL/min (ref >60)
GLUCOSE: 125 mg/dL — AB (ref 65–99)
Potassium: 3.8 mmol/L (ref 3.5–5.1)
Sodium: 139 mmol/L (ref 135–145)

## 2015-06-24 LAB — CBC
HEMATOCRIT: 37.4 % (ref 36.0–46.0)
Hemoglobin: 12.2 g/dL (ref 12.0–15.0)
MCH: 29.5 pg (ref 26.0–34.0)
MCHC: 32.6 g/dL (ref 30.0–36.0)
MCV: 90.3 fL (ref 78.0–100.0)
Platelets: 247 10*3/uL (ref 150–400)
RBC: 4.14 MIL/uL (ref 3.87–5.11)
RDW: 13.7 % (ref 11.5–15.5)
WBC: 13.7 10*3/uL — ABNORMAL HIGH (ref 4.0–10.5)

## 2015-06-24 MED ORDER — METHOCARBAMOL 500 MG PO TABS: 500.0000 mg | ORAL_TABLET | Freq: Four times a day (QID) | ORAL | Status: DC | PRN

## 2015-06-24 MED ORDER — ZOLPIDEM TARTRATE 5 MG PO TABS: 5.0000 mg | ORAL_TABLET | Freq: Every evening | ORAL | Status: DC | PRN

## 2015-06-24 MED ORDER — DOCUSATE SODIUM 100 MG PO CAPS: 100.0000 mg | ORAL_CAPSULE | Freq: Two times a day (BID) | ORAL | Status: DC

## 2015-06-24 MED ORDER — ASPIRIN 325 MG PO TBEC: 325.0000 mg | DELAYED_RELEASE_TABLET | Freq: Two times a day (BID) | ORAL | Status: DC

## 2015-06-24 MED ORDER — OXYCODONE HCL 5 MG PO TABS: 5.0000 mg | ORAL_TABLET | ORAL | Status: DC | PRN

## 2015-06-24 NOTE — Progress Notes (Signed)
OT Cancellation Note  Patient Details Name: Nancy ColeSheila Bryson-Eckroade MRN: [id number] DOB: Aug 02, 1955   Cancelled Treatment:    Reason Eval/Treat Not Completed: OT screened, no needs identified, will sign off -- Patient reports she is a Engineer, civil (consulting)nurse, she had OT at her "home hospital" help her figure out what she needs, and that she has no questions about bathing, dressing, toileting, shower transfer. Reports she has reacher and BSC at home. OT will sign off.  Wiley Magan A 06/24/2015, 11:23 AM

## 2015-06-24 NOTE — Discharge Instructions (Signed)

## 2015-06-24 NOTE — Progress Notes (Signed)
Physical Therapy Treatment Patient Details Name: Adalberto ColeSheila Bryson-Eckroade MRN: [id number] DOB: 11/03/1955 Today's Date: 06/24/2015    History of Present Illness L THR    PT Comments    POD # 1 With spouse and sister present, practiced stairs, reviewed gait safety with walker and B crutches.  Reviewed and performed all TE's on HEP handout.  Instructed on use of ICE.  Instructed on proper tech shower transfer.    Follow Up Recommendations  No PT follow up     Equipment Recommendations       Recommendations for Other Services       Precautions / Restrictions Precautions Precautions: Fall Restrictions Weight Bearing Restrictions: No Other Position/Activity Restrictions: WBAT    Mobility  Bed Mobility               General bed mobility comments: Pt OOB in recliner   Transfers Overall transfer level: Needs assistance Equipment used: Rolling walker (2 wheeled) Transfers: Sit to/from Stand Sit to Stand: Supervision         General transfer comment: cues for LE management and use of UEs to self assist  Ambulation/Gait Ambulation/Gait assistance: Supervision Ambulation Distance (Feet): 52 Feet Assistive device: Rolling walker (2 wheeled);Crutches Gait Pattern/deviations: Step-to pattern Gait velocity: decr   General Gait Details: amb with RW and rutches with 25% VC's on proper sequencing and safety    Stairs Stairs: Yes Stairs assistance: Supervision Stair Management: One rail Left;Step to pattern;Forwards;With crutches Number of Stairs: 4 General stair comments: with spouse and sister practices stairs  Wheelchair Mobility    Modified Rankin (Stroke Patients Only)       Balance                                    Cognition Arousal/Alertness: Awake/alert Behavior During Therapy: WFL for tasks assessed/performed Overall Cognitive Status: Within Functional Limits for tasks assessed                      Exercises   Total Hip  Replacement TE's 10 reps ankle pumps 10 reps knee presses 10 reps heel slides 10 reps SAQ's 10 reps ABD Followed by ICE    General Comments        Pertinent Vitals/Pain Pain Assessment: No/denies pain Pain Location: L hip "burning" Pain Descriptors / Indicators: Burning Pain Intervention(s): Monitored during session;Repositioned;Ice applied    Home Living                      Prior Function            PT Goals (current goals can now be found in the care plan section) Progress towards PT goals: Progressing toward goals    Frequency  7X/week    PT Plan Current plan remains appropriate    Co-evaluation             End of Session Equipment Utilized During Treatment: Gait belt Activity Tolerance: Patient tolerated treatment well Patient left: in chair;with call bell/phone within reach;with chair alarm set;with family/visitor present     Time: 1030-1110 PT Time Calculation (min) (ACUTE ONLY): 40 min  Charges:  $Gait Training: 8-22 mins $Therapeutic Exercise: 8-22 mins $Therapeutic Activity: 8-22 mins                    G Codes:      Felecia ShellingLori Yliana Gravois  PTA [facility]  Acute  Rehab Pager      [phone]

## 2015-06-24 NOTE — Discharge Summary (Signed)
Patient ID: Nancy [name] MRN: [id number] DOB/AGE: 16-Jul-1955 60 y.o.  Admit date: 06/23/2015 Discharge date: 06/24/2015  Admission Diagnoses:  Principal Problem:   Osteoarthritis of left hip Active Problems:   Status post total replacement of left hip   Discharge Diagnoses:  Same  Past Medical History  Diagnosis Date  . Arthritis     Surgeries: Procedure(s): LEFT TOTAL HIP ARTHROPLASTY ANTERIOR APPROACH on 06/23/2015   Consultants:    Discharged Condition: Improved  Hospital Course: Nancy [name] is an 60 y.o. female who was admitted 06/23/2015 for operative treatment ofOsteoarthritis of left hip. Patient has severe unremitting pain that affects sleep, daily activities, and work/hobbies. After pre-op clearance the patient was taken to the operating room on 06/23/2015 and underwent  Procedure(s): LEFT TOTAL HIP ARTHROPLASTY ANTERIOR APPROACH.    Patient was given perioperative antibiotics: Anti-infectives    Start     Dose/Rate Route Frequency Ordered Stop   06/23/15 1400  ceFAZolin (ANCEF) IVPB 2 g/50 mL premix     2 g 100 mL/hr over 30 Minutes Intravenous Every 6 hours 06/23/15 1106 06/23/15 2138   06/23/15 0529  ceFAZolin (ANCEF) IVPB 2 g/50 mL premix     2 g 100 mL/hr over 30 Minutes Intravenous On call to O.R. 06/23/15 0529 06/23/15 0730       Patient was given sequential compression devices, early ambulation, and chemoprophylaxis to prevent DVT.  Patient benefited maximally from hospital stay and there were no complications.    Recent vital signs: Patient Vitals for the past 24 hrs:  BP Temp Temp src Pulse Resp SpO2 Height Weight  06/24/15 0935 (!) 166/81 mmHg 98.3 F (36.8 C) Oral (!) 105 13 97 % - -  06/24/15 0545 130/69 mmHg 98.1 F (36.7 C) Oral (!) 102 12 97 % - -  06/24/15 0101 (!) 174/89 mmHg 98 F (36.7 C) Oral (!) 113 12 96 % - -  06/23/15 2129 (!) 167/89 mmHg 98.4 F (36.9 C) Oral (!) 107 12 96 % - -  06/23/15 1900 (!) 174/83 mmHg  98.2 F (36.8 C) - (!) 103 - 96 % - -  06/23/15 1418 (!) 176/99 mmHg 98.2 F (36.8 C) Oral (!) 112 12 95 % - -  06/23/15 1255 (!) 175/89 mmHg 98.2 F (36.8 C) Oral (!) 109 12 94 % - -  06/23/15 1154 (!) 174/90 mmHg 98.6 F (37 C) Oral (!) 103 12 98 % - -  06/23/15 1101 (!) 161/93 mmHg 98.3 F (36.8 C) - 98 12 98 % - -  06/23/15 1100 (!) 161/93 mmHg 98.3 F (36.8 C) Oral - 12 98 % 5\' 6"  (1.676 m) 117.028 kg (258 lb)  06/23/15 1045 (!) 163/98 mmHg 98 F (36.7 C) - 98 12 98 % - -  06/23/15 1030 (!) 163/95 mmHg 98 F (36.7 C) - 92 12 97 % - -     Recent laboratory studies:  Recent Labs  06/22/15 0955 06/24/15 0446  WBC 8.9 13.7*  HGB 14.3 12.2  HCT 44.5 37.4  PLT 264 247  NA 142 139  K 4.1 3.8  CL 106 107  CO2 24 24  BUN 15 16  CREATININE 0.78 0.80  GLUCOSE 107* 125*  INR 1.12  --   CALCIUM 9.3 8.9     Discharge Medications:     Medication List    TAKE these medications        aspirin 325 MG EC tablet  Take 1 tablet (325 mg total) by mouth  2 (two) times daily after a meal.     docusate sodium 100 MG capsule  Commonly known as:  COLACE  Take 1 capsule (100 mg total) by mouth 2 (two) times daily.     methocarbamol 500 MG tablet  Commonly known as:  ROBAXIN  Take 1 tablet (500 mg total) by mouth every 6 (six) hours as needed for muscle spasms.     oxyCODONE 5 MG immediate release tablet  Commonly known as:  Oxy IR/ROXICODONE  Take 1-2 tablets (5-10 mg total) by mouth every 3 (three) hours as needed for breakthrough pain.     zolpidem 5 MG tablet  Commonly known as:  AMBIEN  Take 1 tablet (5 mg total) by mouth at bedtime as needed for sleep.        Diagnostic Studies: Dg C-arm 1-60 Min-no Report  06/23/2015  CLINICAL DATA: surgery C-ARM 1-60 MINUTES Fluoroscopy was utilized by the requesting physician.  No radiographic interpretation.   Dg Hip Port Unilat With Pelvis 1v Left  06/23/2015  CLINICAL DATA:  Status post total left hip replacement. EXAM: DG  HIP (WITH OR WITHOUT PELVIS) 1V PORT LEFT COMPARISON:  Intraoperative spot fluoroscopic images earlier today FINDINGS: Sequelae of recent left total hip arthroplasty are again identified. The prosthetic components appear normally located. Postoperative soft tissue gas and skin staples are noted. No acute fracture is identified. IMPRESSION: Recent left total hip arthroplasty without evidence of acute complication. Electronically Signed   By: Sebastian Ache M.D.   On: 06/23/2015 09:54   Dg Hip Operative Unilat W Or W/o Pelvis Left  06/23/2015  CLINICAL DATA:  Left total hip arthroplasty EXAM: OPERATIVE left HIP (WITH PELVIS IF PERFORMED) 3 VIEWS TECHNIQUE: Fluoroscopic spot image(s) were submitted for interpretation post-operatively. COMPARISON:  None. FINDINGS: On the initial image there are changes of advanced osteoarthritis involving the left hip. On subsequent images a left total hip arthroplasty device has been placed. The hardware components are in anatomic alignment and no complications are identified. IMPRESSION: 1. Status post left hip arthroplasty. 2. No postoperative complications are identified. Electronically Signed   By: Signa Kell M.D.   On: 06/23/2015 08:53    Disposition:  To home      Discharge Instructions    Call MD / Call 911    Complete by:  As directed   If you experience chest pain or shortness of breath, CALL 911 and be transported to the hospital emergency room.  If you develope a fever above 101 F, pus (white drainage) or increased drainage or redness at the wound, or calf pain, call your surgeon's office.     Constipation Prevention    Complete by:  As directed   Drink plenty of fluids.  Prune juice may be helpful.  You may use a stool softener, such as Colace (over the counter) 100 mg twice a day.  Use MiraLax (over the counter) for constipation as needed.     Diet - low sodium heart healthy    Complete by:  As directed      Discharge patient    Complete by:  As  directed      Increase activity slowly as tolerated    Complete by:  As directed            Follow-up Information    Follow up with Kathryne Hitch, MD In 2 weeks.   Specialty:  Orthopedic Surgery   Contact information:   [address] [place] [address] [phone]  SignedKathryne Hitch 06/24/2015, 10:21 AM

## 2015-06-24 NOTE — Progress Notes (Signed)
Subjective: Patient stable has been ambulating in room   Objective: Vital signs in last 24 hours: Temp:  [97.5 F (36.4 C)-98.6 F (37 C)] 98.1 F (36.7 C) (03/18 0545) Pulse Rate:  [81-113] 102 (03/18 0545) Resp:  [10-16] 12 (03/18 0545) BP: (117-176)/(56-114) 130/69 mmHg (03/18 0545) SpO2:  [94 %-100 %] 97 % (03/18 0545) Weight:  [117.028 kg (258 lb)] 117.028 kg (258 lb) (03/17 1100)  Intake/Output from previous day: 03/17 0701 - 03/18 0700 In: 4870 [P.O.:2040; I.V.:2725; IV Piggyback:105] Out: 3875 [Urine:3725; Blood:150] Intake/Output this shift:    Exam:  Neurovascular intact Sensation intact distally Intact pulses distally  Labs:  Recent Labs  06/22/15 0955 06/24/15 0446  HGB 14.3 12.2    Recent Labs  06/22/15 0955 06/24/15 0446  WBC 8.9 13.7*  RBC 4.84 4.14  HCT 44.5 37.4  PLT 264 247    Recent Labs  06/22/15 0955 06/24/15 0446  NA 142 139  K 4.1 3.8  CL 106 107  CO2 24 24  BUN 15 16  CREATININE 0.78 0.80  GLUCOSE 107* 125*  CALCIUM 9.3 8.9    Recent Labs  06/22/15 0955  INR 1.12    Assessment/Plan: Discharge to home today   Shenelle Klas SCOTT 06/24/2015, 8:27 AM

## 2015-06-26 NOTE — Progress Notes (Signed)
Utilization review complete. Derick Seminara RN CCM Case Mgmt phone [phone] 

## 2015-08-18 NOTE — Progress Notes (Signed)
Faxed previous note to Dr Magnus IvanBlackman via epic

## 2015-08-18 NOTE — Progress Notes (Signed)
States lives out of town and will not be in [place] until 08/30/15.  States did labs and PST appt the day before surgery in March. States that Dr Magnus IvanBlackman is aware

## 2015-08-22 ENCOUNTER — Other Ambulatory Visit: Payer: Self-pay | Admitting: Physician Assistant

## 2015-08-28 NOTE — Patient Instructions (Addendum)
Nancy [name]  08/28/2015   Your procedure is scheduled on: 09/01/15  [facility]  [facility] 3rd floor to  Short Stay Center at  7:15 am  Call this number if you have problems the morning of surgery [phone]   Remember: ONLY 1 PERSON MAY GO WITH YOU TO SHORT STAY TO GET  READY MORNING OF YOUR SURGERY.  Do not eat food or drink liquids :After Midnight.     Take these medicines the morning of surgery with A SIP OF WATER:                                               Do not wear jewelry, make-up, lotions, powders, deodorant               Do not shave  48 hours prior to surgery.                 Do not bring valuables to the hospital. [facility] IS NOT             RESPONSIBLE   FOR VALUABLES.  Contacts, dentures or bridgework may not be worn into surgery.  Leave suitcase in the car. After surgery it may be brought to your room.  _____________________________________________________________________             [facility] - Preparing for [facility] Before surgery, you can play an important role.  Because skin is not sterile, your skin needs to be as free of germs as possible.  You can reduce the number of germs on your skin by washing with CHG (chlorahexidine gluconate) soap before surgery.  CHG is an antiseptic cleaner which kills germs and bonds with the skin to continue killing germs even after washing. Please DO NOT use if you have an allergy to CHG or antibacterial soaps.  If your skin becomes reddened/irritated stop using the CHG and inform your nurse when you arrive at Short Stay. Do not shave (including legs and underarms) for at least 48 hours prior to the first CHG shower.  You may shave your face/neck. Please follow these instructions carefully:  1.  Shower with CHG Soap the night before surgery and the  morning of Surgery.  2.  If you choose to wash your hair, wash your hair first as usual with your   normal  shampoo.  3.  After you shampoo, rinse your hair and body thoroughly to remove the  shampoo.                           4.  Use CHG as you would any other liquid soap.  You can apply chg directly  to the skin and wash                       Gently with a scrungie or clean washcloth.  5.  Apply the CHG Soap to your body ONLY FROM THE NECK DOWN.   Do not use on face/ open                           Wound or open sores. Avoid contact with eyes, ears  mouth and genitals (private parts).                       Wash face,  Genitals (private parts) with your normal soap.             6.  Wash thoroughly, paying special attention to the area where your surgery  will be performed.  7.  Thoroughly rinse your body with warm water from the neck down.  8.  DO NOT shower/wash with your normal soap after using and rinsing off  the CHG Soap.                9.  Pat yourself dry with a clean towel.            10.  Wear clean pajamas.            11.  Place clean sheets on your bed the night of your first shower and do not  sleep with pets. Day of Surgery : Do not apply any lotions/deodorants the morning of surgery.  Please wear clean clothes to the hospital/surgery center.  FAILURE TO FOLLOW THESE INSTRUCTIONS MAY RESULT IN THE CANCELLATION OF YOUR SURGERY PATIENT SIGNATURE_________________________________  NURSE SIGNATURE__________________________________  ________________________________________________________________________   Nancy [name]  An incentive spirometer is a tool that can help keep your lungs clear and active. This tool measures how well you are filling your lungs with each breath. Taking long deep breaths may help reverse or decrease the chance of developing breathing (pulmonary) problems (especially infection) following:  A long period of time when you are unable to move or be active. BEFORE THE PROCEDURE   If the spirometer includes an indicator to show your best effort, your  nurse or respiratory therapist will set it to a desired goal.  If possible, sit up straight or lean slightly forward. Try not to slouch.  Hold the incentive spirometer in an upright position. INSTRUCTIONS FOR USE  1. Sit on the edge of your bed if possible, or sit up as far as you can in bed or on a chair. 2. Hold the incentive spirometer in an upright position. 3. Breathe out normally. 4. Place the mouthpiece in your mouth and seal your lips tightly around it. 5. Breathe in slowly and as deeply as possible, raising the piston or the ball toward the top of the column. 6. Hold your breath for 3-5 seconds or for as long as possible. Allow the piston or ball to fall to the bottom of the column. 7. Remove the mouthpiece from your mouth and breathe out normally. 8. Rest for a few seconds and repeat Steps 1 through 7 at least 10 times every 1-2 hours when you are awake. Take your time and take a few normal breaths between deep breaths. 9. The spirometer may include an indicator to show your best effort. Use the indicator as a goal to work toward during each repetition. 10. After each set of 10 deep breaths, practice coughing to be sure your lungs are clear. If you have an incision (the cut made at the time of surgery), support your incision when coughing by placing a pillow or rolled up towels firmly against it. Once you are able to get out of bed, walk around indoors and cough well. You may stop using the incentive spirometer when instructed by your caregiver.  RISKS AND COMPLICATIONS  Take your time so you do not get dizzy or light-headed.  If you are in pain,  you may need to take or ask for pain medication before doing incentive spirometry. It is harder to take a deep breath if you are having pain. AFTER USE  Rest and breathe slowly and easily.  It can be helpful to keep track of a log of your progress. Your caregiver can provide you with a simple table to help with this. If you are using the  spirometer at home, follow these instructions: SEEK MEDICAL CARE IF:   You are having difficultly using the spirometer.  You have trouble using the spirometer as often as instructed.  Your pain medication is not giving enough relief while using the spirometer.  You develop fever of 100.5 F (38.1 C) or higher. SEEK IMMEDIATE MEDICAL CARE IF:   You cough up bloody sputum that had not been present before.  You develop fever of 102 F (38.9 C) or greater.  You develop worsening pain at or near the incision site. MAKE SURE YOU:   Understand these instructions.  Will watch your condition.  Will get help right away if you are not doing well or get worse. Document Released: 08/05/2006 Document Revised: 06/17/2011 Document Reviewed: 10/06/2006 [facility] Patient Information 2014 [place], [place].   ________________________________________________________________________

## 2015-08-31 ENCOUNTER — Encounter (HOSPITAL_COMMUNITY)
Admission: RE | Admit: 2015-08-31 | Discharge: 2015-08-31 | Disposition: A | Discharge status: Home / Self Care | Payer: BLUE CROSS/BLUE SHIELD | Source: Ambulatory Visit | Attending: Orthopaedic Surgery | Admitting: Orthopaedic Surgery

## 2015-08-31 ENCOUNTER — Encounter (HOSPITAL_COMMUNITY): Payer: Self-pay

## 2015-08-31 HISTORY — DX: Essential (primary) hypertension: I10

## 2015-08-31 HISTORY — DX: Other specified health status: Z78.9

## 2015-08-31 LAB — CBC
HCT: 42.2 % (ref 36.0–46.0)
HEMOGLOBIN: 13.7 g/dL (ref 12.0–15.0)
MCH: 29.1 pg (ref 26.0–34.0)
MCHC: 32.5 g/dL (ref 30.0–36.0)
MCV: 89.8 fL (ref 78.0–100.0)
Platelets: 271 10*3/uL (ref 150–400)
RBC: 4.7 MIL/uL (ref 3.87–5.11)
RDW: 14.6 % (ref 11.5–15.5)
WBC: 7.4 10*3/uL (ref 4.0–10.5)

## 2015-09-01 ENCOUNTER — Inpatient Hospital Stay (HOSPITAL_COMMUNITY)
Admission: RE | Admit: 2015-09-01 | Discharge: 2015-09-02 | DRG: 470 | Disposition: A | Discharge status: Home / Self Care | Payer: BLUE CROSS/BLUE SHIELD | Source: Ambulatory Visit | Attending: Orthopaedic Surgery | Admitting: Orthopaedic Surgery

## 2015-09-01 ENCOUNTER — Encounter (HOSPITAL_COMMUNITY): Payer: Self-pay | Admitting: *Deleted

## 2015-09-01 ENCOUNTER — Encounter (HOSPITAL_COMMUNITY)
Admission: RE | Disposition: A | Discharge status: Home / Self Care | Payer: Self-pay | Source: Ambulatory Visit | Attending: Orthopaedic Surgery

## 2015-09-01 ENCOUNTER — Inpatient Hospital Stay (HOSPITAL_COMMUNITY): Payer: BLUE CROSS/BLUE SHIELD | Admitting: Certified Registered Nurse Anesthetist

## 2015-09-01 ENCOUNTER — Inpatient Hospital Stay (HOSPITAL_COMMUNITY): Payer: BLUE CROSS/BLUE SHIELD

## 2015-09-01 DIAGNOSIS — M25561 Pain in right knee: Secondary | ICD-10-CM | POA: Diagnosis present

## 2015-09-01 DIAGNOSIS — Z01812 Encounter for preprocedural laboratory examination: Secondary | ICD-10-CM

## 2015-09-01 DIAGNOSIS — M21161 Varus deformity, not elsewhere classified, right knee: Secondary | ICD-10-CM | POA: Diagnosis present

## 2015-09-01 DIAGNOSIS — M1711 Unilateral primary osteoarthritis, right knee: Principal | ICD-10-CM

## 2015-09-01 DIAGNOSIS — Z96651 Presence of right artificial knee joint: Secondary | ICD-10-CM

## 2015-09-01 DIAGNOSIS — Z96642 Presence of left artificial hip joint: Secondary | ICD-10-CM | POA: Diagnosis present

## 2015-09-01 DIAGNOSIS — M659 Synovitis and tenosynovitis, unspecified: Secondary | ICD-10-CM | POA: Diagnosis present

## 2015-09-01 HISTORY — PX: TOTAL KNEE ARTHROPLASTY: SHX125

## 2015-09-01 LAB — SURGICAL PCR SCREEN
MRSA, PCR: NEGATIVE
STAPHYLOCOCCUS AUREUS: NEGATIVE

## 2015-09-01 SURGERY — ARTHROPLASTY, KNEE, TOTAL
Anesthesia: Spinal | Site: Knee | Laterality: Right

## 2015-09-01 MED ORDER — MENTHOL 3 MG MT LOZG: 1.0000 | LOZENGE | OROMUCOSAL | Status: DC | PRN

## 2015-09-01 MED ORDER — KETOROLAC TROMETHAMINE 15 MG/ML IJ SOLN
7.5000 mg | Freq: Four times a day (QID) | INTRAMUSCULAR | Status: AC
  Administered 2015-09-01 – 2015-09-02 (×4): 7.5 mg via INTRAVENOUS
  Filled 2015-09-01 (×4): qty 1

## 2015-09-01 MED ORDER — OXYCODONE HCL 5 MG PO TABS
5.0000 mg | ORAL_TABLET | ORAL | Status: DC | PRN
  Administered 2015-09-01 – 2015-09-02 (×4): 5 mg via ORAL
  Filled 2015-09-01 (×3): qty 1
  Filled 2015-09-01: qty 2

## 2015-09-01 MED ORDER — ONDANSETRON HCL 4 MG/2ML IJ SOLN
INTRAMUSCULAR | Status: AC
  Filled 2015-09-01: qty 2

## 2015-09-01 MED ORDER — OXYCODONE HCL 5 MG/5ML PO SOLN
5.0000 mg | Freq: Once | ORAL | Status: DC | PRN
  Filled 2015-09-01: qty 5

## 2015-09-01 MED ORDER — CHLORHEXIDINE GLUCONATE 4 % EX LIQD: 60.0000 mL | Freq: Once | CUTANEOUS | Status: DC

## 2015-09-01 MED ORDER — ACETAMINOPHEN 650 MG RE SUPP: 650.0000 mg | Freq: Four times a day (QID) | RECTAL | Status: DC | PRN

## 2015-09-01 MED ORDER — RIVAROXABAN 10 MG PO TABS
10.0000 mg | ORAL_TABLET | Freq: Every day | ORAL | Status: DC
  Administered 2015-09-02: 10 mg via ORAL
  Filled 2015-09-01: qty 1

## 2015-09-01 MED ORDER — METHOCARBAMOL 500 MG PO TABS: 500.0000 mg | ORAL_TABLET | Freq: Four times a day (QID) | ORAL | Status: DC | PRN

## 2015-09-01 MED ORDER — MIDAZOLAM HCL 5 MG/5ML IJ SOLN
INTRAMUSCULAR | Status: DC | PRN
  Administered 2015-09-01: 2 mg via INTRAVENOUS

## 2015-09-01 MED ORDER — SODIUM CHLORIDE 0.9 % IR SOLN
Status: DC | PRN
  Administered 2015-09-01: 1000 mL

## 2015-09-01 MED ORDER — PROPOFOL 10 MG/ML IV BOLUS
INTRAVENOUS | Status: AC
  Filled 2015-09-01: qty 20

## 2015-09-01 MED ORDER — STERILE WATER FOR IRRIGATION IR SOLN
Status: DC | PRN
  Administered 2015-09-01: 2000 mL

## 2015-09-01 MED ORDER — ONDANSETRON HCL 4 MG/2ML IJ SOLN: 4.0000 mg | Freq: Four times a day (QID) | INTRAMUSCULAR | Status: DC | PRN

## 2015-09-01 MED ORDER — ACETAMINOPHEN 325 MG PO TABS
650.0000 mg | ORAL_TABLET | Freq: Four times a day (QID) | ORAL | Status: DC | PRN
  Administered 2015-09-01 – 2015-09-02 (×4): 650 mg via ORAL
  Filled 2015-09-01 (×4): qty 2

## 2015-09-01 MED ORDER — DIPHENHYDRAMINE HCL 12.5 MG/5ML PO ELIX: 12.5000 mg | ORAL_SOLUTION | ORAL | Status: DC | PRN

## 2015-09-01 MED ORDER — EPHEDRINE SULFATE 50 MG/ML IJ SOLN
INTRAMUSCULAR | Status: AC
  Filled 2015-09-01: qty 1

## 2015-09-01 MED ORDER — PROPOFOL 10 MG/ML IV BOLUS
INTRAVENOUS | Status: AC
  Filled 2015-09-01: qty 40

## 2015-09-01 MED ORDER — BUPIVACAINE IN DEXTROSE 0.75-8.25 % IT SOLN
INTRATHECAL | Status: DC | PRN
  Administered 2015-09-01: 2 mL via INTRATHECAL

## 2015-09-01 MED ORDER — FENTANYL CITRATE (PF) 100 MCG/2ML IJ SOLN
INTRAMUSCULAR | Status: DC | PRN
  Administered 2015-09-01 (×2): 50 ug via INTRAVENOUS

## 2015-09-01 MED ORDER — ZOLPIDEM TARTRATE 5 MG PO TABS: 5.0000 mg | ORAL_TABLET | Freq: Every evening | ORAL | Status: DC | PRN

## 2015-09-01 MED ORDER — CEFAZOLIN SODIUM-DEXTROSE 2-4 GM/100ML-% IV SOLN
2.0000 g | INTRAVENOUS | Status: AC
  Administered 2015-09-01: 2 g via INTRAVENOUS
  Filled 2015-09-01: qty 100

## 2015-09-01 MED ORDER — TRANEXAMIC ACID 1000 MG/10ML IV SOLN
1000.0000 mg | INTRAVENOUS | Status: AC
  Administered 2015-09-01: 1000 mg via INTRAVENOUS
  Filled 2015-09-01: qty 10

## 2015-09-01 MED ORDER — METOCLOPRAMIDE HCL 5 MG PO TABS: 5.0000 mg | ORAL_TABLET | Freq: Three times a day (TID) | ORAL | Status: DC | PRN

## 2015-09-01 MED ORDER — SODIUM CHLORIDE 0.9 % IR SOLN
Status: DC | PRN
  Administered 2015-09-01: 2000 mL

## 2015-09-01 MED ORDER — ONDANSETRON HCL 4 MG PO TABS: 4.0000 mg | ORAL_TABLET | Freq: Four times a day (QID) | ORAL | Status: DC | PRN

## 2015-09-01 MED ORDER — SODIUM CHLORIDE 0.9 % IJ SOLN
INTRAMUSCULAR | Status: AC
  Filled 2015-09-01: qty 10

## 2015-09-01 MED ORDER — PHENOL 1.4 % MT LIQD: 1.0000 | OROMUCOSAL | Status: DC | PRN

## 2015-09-01 MED ORDER — CEFAZOLIN SODIUM 1-5 GM-% IV SOLN
1.0000 g | Freq: Four times a day (QID) | INTRAVENOUS | Status: AC
  Administered 2015-09-01 (×2): 1 g via INTRAVENOUS
  Filled 2015-09-01 (×2): qty 50

## 2015-09-01 MED ORDER — ALUM & MAG HYDROXIDE-SIMETH 200-200-20 MG/5ML PO SUSP: 30.0000 mL | ORAL | Status: DC | PRN

## 2015-09-01 MED ORDER — MEPERIDINE HCL 50 MG/ML IJ SOLN: 6.2500 mg | INTRAMUSCULAR | Status: DC | PRN

## 2015-09-01 MED ORDER — CEFAZOLIN SODIUM-DEXTROSE 2-4 GM/100ML-% IV SOLN
INTRAVENOUS | Status: AC
  Filled 2015-09-01: qty 100

## 2015-09-01 MED ORDER — HYDROMORPHONE HCL 1 MG/ML IJ SOLN: 0.2500 mg | INTRAMUSCULAR | Status: DC | PRN

## 2015-09-01 MED ORDER — PROPOFOL 500 MG/50ML IV EMUL
INTRAVENOUS | Status: DC | PRN
Start: 2015-09-01 — End: 2015-09-01
  Administered 2015-09-01: 100 ug/kg/min via INTRAVENOUS

## 2015-09-01 MED ORDER — SODIUM CHLORIDE 0.9 % IV SOLN
INTRAVENOUS | Status: DC
  Administered 2015-09-01: 14:00:00 via INTRAVENOUS

## 2015-09-01 MED ORDER — METHOCARBAMOL 1000 MG/10ML IJ SOLN
500.0000 mg | Freq: Four times a day (QID) | INTRAVENOUS | Status: DC | PRN
  Administered 2015-09-01: 500 mg via INTRAVENOUS
  Filled 2015-09-01: qty 550
  Filled 2015-09-01: qty 5

## 2015-09-01 MED ORDER — HYDROMORPHONE HCL 1 MG/ML IJ SOLN: 1.0000 mg | INTRAMUSCULAR | Status: DC | PRN

## 2015-09-01 MED ORDER — LIDOCAINE HCL (CARDIAC) 20 MG/ML IV SOLN
INTRAVENOUS | Status: AC
Start: 2015-09-01 — End: 2015-09-01
  Filled 2015-09-01: qty 5

## 2015-09-01 MED ORDER — METOCLOPRAMIDE HCL 5 MG/ML IJ SOLN: 5.0000 mg | Freq: Three times a day (TID) | INTRAMUSCULAR | Status: DC | PRN

## 2015-09-01 MED ORDER — OXYCODONE HCL 5 MG PO TABS: 5.0000 mg | ORAL_TABLET | Freq: Once | ORAL | Status: DC | PRN

## 2015-09-01 MED ORDER — LIDOCAINE HCL (CARDIAC) 20 MG/ML IV SOLN
INTRAVENOUS | Status: DC | PRN
  Administered 2015-09-01: 100 mg via INTRAVENOUS

## 2015-09-01 MED ORDER — LACTATED RINGERS IV SOLN
INTRAVENOUS | Status: DC
  Administered 2015-09-01 (×2): via INTRAVENOUS

## 2015-09-01 MED ORDER — DOCUSATE SODIUM 100 MG PO CAPS
100.0000 mg | ORAL_CAPSULE | Freq: Two times a day (BID) | ORAL | Status: DC
  Filled 2015-09-01: qty 1

## 2015-09-01 MED ORDER — FENTANYL CITRATE (PF) 100 MCG/2ML IJ SOLN
INTRAMUSCULAR | Status: AC
  Filled 2015-09-01: qty 2

## 2015-09-01 MED ORDER — MIDAZOLAM HCL 2 MG/2ML IJ SOLN
INTRAMUSCULAR | Status: AC
  Filled 2015-09-01: qty 2

## 2015-09-01 SURGICAL SUPPLY — 54 items
BAG ZIPLOCK 12X15 (MISCELLANEOUS) ×2 IMPLANT
BANDAGE ACE 6X5 VEL STRL LF (GAUZE/BANDAGES/DRESSINGS) ×4 IMPLANT
BLADE SAG 13.0X1.37X90 (BLADE) IMPLANT
BLADE SAG 18X100X1.27 (BLADE) IMPLANT
BOWL SMART MIX CTS (DISPOSABLE) ×2 IMPLANT
CAPT KNEE TOTAL 3 ×2 IMPLANT
CEMENT BONE SIMPLEX SPEEDSET (Cement) ×4 IMPLANT
CLOTH BEACON ORANGE TIMEOUT ST (SAFETY) ×2 IMPLANT
CUFF TOURN SGL QUICK 34 (TOURNIQUET CUFF) ×1
CUFF TRNQT CYL 34X4X40X1 (TOURNIQUET CUFF) ×1 IMPLANT
DRAPE U-SHAPE 47X51 STRL (DRAPES) ×2 IMPLANT
DRSG AQUACEL AG ADV 3.5X10 (GAUZE/BANDAGES/DRESSINGS) ×2 IMPLANT
DRSG PAD ABDOMINAL 8X10 ST (GAUZE/BANDAGES/DRESSINGS) ×4 IMPLANT
DURAPREP 26ML APPLICATOR (WOUND CARE) ×2 IMPLANT
ELECT REM PT RETURN 9FT ADLT (ELECTROSURGICAL) ×2
ELECTRODE REM PT RTRN 9FT ADLT (ELECTROSURGICAL) ×1 IMPLANT
GAUZE SPONGE 4X4 12PLY STRL (GAUZE/BANDAGES/DRESSINGS) ×2 IMPLANT
GAUZE XEROFORM 1X8 LF (GAUZE/BANDAGES/DRESSINGS) ×2 IMPLANT
GLOVE BIO SURGEON STRL SZ7.5 (GLOVE) ×2 IMPLANT
GLOVE BIO SURGEON STRL SZ8 (GLOVE) ×2 IMPLANT
GLOVE BIOGEL PI IND STRL 6.5 (GLOVE) ×1 IMPLANT
GLOVE BIOGEL PI IND STRL 7.5 (GLOVE) ×2 IMPLANT
GLOVE BIOGEL PI IND STRL 8 (GLOVE) ×3 IMPLANT
GLOVE BIOGEL PI INDICATOR 6.5 (GLOVE) ×1
GLOVE BIOGEL PI INDICATOR 7.5 (GLOVE) ×2
GLOVE BIOGEL PI INDICATOR 8 (GLOVE) ×3
GLOVE ECLIPSE 6.5 STRL STRAW (GLOVE) ×2 IMPLANT
GLOVE ECLIPSE 8.0 STRL XLNG CF (GLOVE) ×2 IMPLANT
GOWN SPEC L3 XXLG W/TWL (GOWN DISPOSABLE) ×2 IMPLANT
GOWN STRL REUS W/TWL LRG LVL3 (GOWN DISPOSABLE) ×2 IMPLANT
GOWN STRL REUS W/TWL XL LVL3 (GOWN DISPOSABLE) ×4 IMPLANT
HANDPIECE INTERPULSE COAX TIP (DISPOSABLE) ×1
IMMOBILIZER KNEE 20 (SOFTGOODS) ×4 IMPLANT
IMMOBILIZER KNEE 20 THIGH 36 (SOFTGOODS) ×1 IMPLANT
PACK TOTAL KNEE CUSTOM (KITS) ×2 IMPLANT
PAD ABD 8X10 STRL (GAUZE/BANDAGES/DRESSINGS) ×2 IMPLANT
PADDING CAST COTTON 6X4 STRL (CAST SUPPLIES) ×4 IMPLANT
POSITIONER SURGICAL ARM (MISCELLANEOUS) ×2 IMPLANT
SET HNDPC FAN SPRY TIP SCT (DISPOSABLE) ×1 IMPLANT
SET PAD KNEE POSITIONER (MISCELLANEOUS) ×2 IMPLANT
STAPLER VISISTAT 35W (STAPLE) IMPLANT
SUCTION FRAZIER HANDLE 12FR (TUBING) ×1
SUCTION TUBE FRAZIER 12FR DISP (TUBING) ×1 IMPLANT
SUT MNCRL AB 4-0 PS2 18 (SUTURE) IMPLANT
SUT VIC AB 0 CT1 27 (SUTURE) ×1
SUT VIC AB 0 CT1 27XBRD ANTBC (SUTURE) ×1 IMPLANT
SUT VIC AB 0 CT1 36 (SUTURE) ×2 IMPLANT
SUT VIC AB 1 CT1 27 (SUTURE) ×2
SUT VIC AB 1 CT1 27XBRD ANTBC (SUTURE) ×2 IMPLANT
SUT VIC AB 2-0 CT1 27 (SUTURE) ×2
SUT VIC AB 2-0 CT1 TAPERPNT 27 (SUTURE) ×2 IMPLANT
TRAY FOLEY W/METER SILVER 14FR (SET/KITS/TRAYS/PACK) ×2 IMPLANT
WRAP KNEE MAXI GEL POST OP (GAUZE/BANDAGES/DRESSINGS) ×2 IMPLANT
YANKAUER SUCT BULB TIP 10FT TU (MISCELLANEOUS) ×2 IMPLANT

## 2015-09-01 NOTE — Brief Op Note (Signed)
09/01/2015  11:04 AM  PATIENT:  Adalberto ColeSheila Bryson-Eckroade  60 y.o. female  PRE-OPERATIVE DIAGNOSIS:  Severe osteoarthritis right knee  POST-OPERATIVE DIAGNOSIS:  Severe osteoarthritis right knee  PROCEDURE:  Procedure(s): RIGHT TOTAL KNEE ARTHROPLASTY (Right)  SURGEON:  Surgeon(s) and Role:    * Kathryne Hitchhristopher Y Blackman, MD - Primary  PHYSICIAN ASSISTANT: Rexene EdisonGil Clark, PA-C  ANESTHESIA:   spinal  EBL:  Total I/O In: 1000 [I.V.:1000] Out: 350 [Urine:300; Blood:50]  COUNTS:  YES  TOURNIQUET:   Total Tourniquet Time Documented: Thigh (Right) - 60 minutes Total: Thigh (Right) - 60 minutes   DICTATION: .Other Dictation: Dictation Number [id number]  PLAN OF CARE: Admit to inpatient   PATIENT DISPOSITION:  PACU - hemodynamically stable.   Delay start of Pharmacological VTE agent (>24hrs) due to surgical blood loss or risk of bleeding: no

## 2015-09-01 NOTE — Anesthesia Postprocedure Evaluation (Signed)
Anesthesia Post Note  Patient: Adalberto ColeSheila Bryson-Eckroade  Procedure(s) Performed: Procedure(s) (LRB): RIGHT TOTAL KNEE ARTHROPLASTY (Right)  Patient location during evaluation: PACU Anesthesia Type: General Level of consciousness: awake and alert Pain management: pain level controlled Vital Signs Assessment: post-procedure vital signs reviewed and stable Respiratory status: spontaneous breathing, nonlabored ventilation, respiratory function stable and patient connected to nasal cannula oxygen Cardiovascular status: blood pressure returned to baseline and stable Postop Assessment: no signs of nausea or vomiting and spinal receding Anesthetic complications: no    Last Vitals:  Filed Vitals:   09/01/15 1230 09/01/15 1259  BP: 160/93 153/79  Pulse: 84 86  Temp: 36.6 C 36.3 C  Resp: 18 16    Last Pain:  Filed Vitals:   09/01/15 1300  PainSc: 0-No pain                 Orilla Templeman A

## 2015-09-01 NOTE — Anesthesia Preprocedure Evaluation (Addendum)
Anesthesia Evaluation  Patient identified by MRN, date of birth, ID band Patient awake    Reviewed: Allergy & Precautions, NPO status , Patient's Chart, lab work & pertinent test results  Airway Mallampati: I  TM Distance: >3 FB Neck ROM: Full    Dental  (+) Teeth Intact, Dental Advisory Given   Pulmonary    breath sounds clear to auscultation       Cardiovascular hypertension ("White coat syndrome" only; no meds),  Rhythm:Regular Rate:Normal     Neuro/Psych    GI/Hepatic   Endo/Other    Renal/GU      Musculoskeletal   Abdominal   Peds  Hematology   Anesthesia Other Findings   Reproductive/Obstetrics                            Anesthesia Physical Anesthesia Plan  ASA: I  Anesthesia Plan: Spinal   Post-op Pain Management:    Induction: Intravenous  Airway Management Planned: Simple Face Mask  Additional Equipment:   Intra-op Plan:   Post-operative Plan:   Informed Consent: I have reviewed the patients History and Physical, chart, labs and discussed the procedure including the risks, benefits and alternatives for the proposed anesthesia with the patient or authorized representative who has indicated his/her understanding and acceptance.   Dental advisory given  Plan Discussed with: CRNA, Anesthesiologist and Surgeon  Anesthesia Plan Comments:         Anesthesia Quick Evaluation

## 2015-09-01 NOTE — Progress Notes (Signed)
Portable AP and Lateral Right Knee X-rays done. 

## 2015-09-01 NOTE — Anesthesia Procedure Notes (Signed)
Spinal Patient location during procedure: OR Staffing Anesthesiologist: Floria Brandau Performed by: anesthesiologist  Preanesthetic Checklist Completed: patient identified, site marked, surgical consent, pre-op evaluation, timeout performed, IV checked, risks and benefits discussed and monitors and equipment checked Spinal Block Patient position: sitting Prep: Betadine Patient monitoring: continuous pulse ox Approach: right paramedian Location: L3-4 Injection technique: single-shot Needle Needle type: Sprotte  Needle gauge: 25 G Needle length: 10 cm Needle insertion depth: 6 cm Assessment Sensory level: T8 Additional Notes Pt tolerated procedure well.  CSF in 4 quadrants. No blood.  15 mg of Spinal Marcaine given slowly.

## 2015-09-01 NOTE — Evaluation (Signed)
Physical Therapy Evaluation Patient Details Name: Nancy [name] MRN: [id number] DOB: [birth year] Today's Date: 09/01/2015   History of Present Illness  Pt s/p R TKR with hx of L THR (3/17)  Clinical Impression  Pt s/p R TKR presents with decreased R LE strength/ROM and post op pain limiting functional mobility.  Pt should progress well to dc home with family assist.    Follow Up Recommendations Outpatient PT    Equipment Recommendations  None recommended by PT    Recommendations for Other Services       Precautions / Restrictions Precautions Precautions: Knee;Fall Required Braces or Orthoses: Knee Immobilizer - Right Knee Immobilizer - Right: Discontinue once straight leg raise with < 10 degree lag Restrictions [name] Bearing Restrictions: No Other Position/Activity Restrictions: WBAT      Mobility  Bed Mobility Overal bed mobility: Needs Assistance Bed Mobility: Supine to Sit     Supine to sit: Min assist     General bed mobility comments: min assist to manage R LE  Transfers Overall transfer level: Needs assistance Equipment used: Rolling walker (2 wheeled) Transfers: Sit to/from Stand Sit to Stand: Min guard         General transfer comment: cues for LE management and use of UEs to self assist  Ambulation/Gait Ambulation/Gait assistance: Min assist;Min guard Ambulation Distance (Feet): 95 Feet Assistive device: Rolling walker (2 wheeled) Gait Pattern/deviations: Decreased step length - right;Decreased step length - left;Shuffle;Step-to pattern;Step-through pattern;Trunk flexed Gait velocity: decr   General Gait Details: cues for posture, position from RW and initial sequence  Stairs            Wheelchair Mobility    Modified Rankin (Stroke Patients Only)       Balance                                             Pertinent Vitals/Pain Pain Assessment: 0-10 Pain Score: 4  Pain Location: R knee Pain  Descriptors / Indicators: Aching;Sore Pain Intervention(s): Limited activity within patient's tolerance;Monitored during session;Premedicated before session;Ice applied    Home Living Family/patient expects to be discharged to:: Private residence Living Arrangements: Spouse/significant other Available Help at Discharge: Family Type of Home: House Home Access: Stairs to enter Entrance Stairs-Rails: Can reach both Entrance Stairs-Number of Steps: 4 Home Layout: One level Home Equipment: Walker - 2 wheels;Cane - single point Additional Comments: Pt plans to initially stay at hotel with sister for several days    Prior Function Level of Independence: Independent;Independent with assistive device(s)         Comments: cane as needed     Hand Dominance        Extremity/Trunk Assessment   Upper Extremity Assessment: Overall WFL for tasks assessed           Lower Extremity Assessment: RLE deficits/detail RLE Deficits / Details: 2+/5 quads with AAROM at knee -10 - 70    Cervical / Trunk Assessment: Normal  Communication   Communication: No difficulties  Cognition Arousal/Alertness: Awake/alert Behavior During Therapy: WFL for tasks assessed/performed Overall Cognitive Status: Within Functional Limits for tasks assessed                      General Comments      Exercises Total Joint Exercises Ankle Circles/Pumps: AROM;Both;10 reps;Supine Heel Slides: AAROM;Right;15 reps;Supine      Assessment/Plan  PT Assessment Patient needs continued PT services  PT Diagnosis Difficulty walking   PT Problem List Decreased strength;Decreased range of motion;Decreased activity tolerance;Decreased mobility;Decreased knowledge of use of DME;Obesity;Pain  PT Treatment Interventions DME instruction;Gait training;Stair training;Functional mobility training;Therapeutic activities;Therapeutic exercise;Patient/family education   PT Goals (Current goals can be found in the  Care Plan section) Acute Rehab PT Goals Patient Stated Goal: Regain IND PT Goal Formulation: With patient Time For Goal Achievement: 09/04/15 Potential to Achieve Goals: Good    Frequency 7X/week   Barriers to discharge        Co-evaluation               End of Session Equipment Utilized During Treatment: Right knee immobilizer Activity Tolerance: Patient tolerated treatment well Patient left: in chair;with call bell/phone within reach;with chair alarm set Nurse Communication: Mobility status         Time: [id number] PT Time Calculation (min) (ACUTE ONLY): 35 min   Charges:   PT Evaluation $PT Eval Low Complexity: 1 Procedure PT Treatments $Gait Training: 8-22 mins   PT G Codes:        Nancy [name] 09/01/2015, 5:30 PM

## 2015-09-01 NOTE — Transfer of Care (Signed)
Immediate Anesthesia Transfer of Care Note  Patient: Nancy ColeSheila [name]  Procedure(s) Performed: Procedure(s): RIGHT TOTAL KNEE ARTHROPLASTY (Right)  Patient Location: PACU  Anesthesia Type:Spinal  Level of Consciousness:  sedated, patient cooperative and responds to stimulation  Airway & Oxygen Therapy:Patient Spontanous Breathing and Patient connected to face mask oxgen  Post-op Assessment:  Report given to PACU RN and Post -op Vital signs reviewed and stable  Post vital signs:  Reviewed and stable  Last Vitals:  Filed Vitals:   09/01/15 0724  BP: 173/95  Pulse: 109  Temp: 36.7 C  Resp: 18    Complications: No apparent anesthesia complications

## 2015-09-01 NOTE — Progress Notes (Signed)
UR review complete, [employer]. Theotis Gerdeman RN CCM Case Mgmt phone [phone] 

## 2015-09-01 NOTE — H&P (Signed)
TOTAL KNEE ADMISSION H&P  Patient is being admitted for right total knee arthroplasty.  Subjective:  Chief Complaint:right knee pain.  HPI: Nancy [name], 60 y.o. female, has a history of pain and functional disability in the right knee due to arthritis and has failed non-surgical conservative treatments for greater than 12 weeks to includeNSAID's and/or analgesics, corticosteriod injections, viscosupplementation injections, flexibility and strengthening excercises, weight reduction as appropriate and activity modification.  Onset of symptoms was gradual, starting 5 years ago with gradually worsening course since that time. The patient noted no past surgery on the right knee(s).  Patient currently rates pain in the right knee(s) at 10 out of 10 with activity. Patient has night pain, worsening of pain with activity and weight bearing, pain that interferes with activities of daily living, pain with passive range of motion, crepitus and joint swelling.  Patient has evidence of subchondral cysts, subchondral sclerosis, periarticular osteophytes and joint space narrowing by imaging studies. There is no active infection.  Patient Active Problem List   Diagnosis Date Noted  . Osteoarthritis of right knee 09/01/2015  . Osteoarthritis of left hip 06/23/2015  . Status post total replacement of left hip 06/23/2015   Past Medical History  Diagnosis Date  . Arthritis   . Medical history non-contributory   . Hypertension     "white coat syndrome"- no meds    Past Surgical History  Procedure Laterality Date  . Total hip arthroplasty Left 06/23/2015    Procedure: LEFT TOTAL HIP ARTHROPLASTY ANTERIOR APPROACH;  Surgeon: Kathryne Hitchhristopher Y Blackman, MD;  Location: [facility];  Service: Orthopedics;  Laterality: Left;    No prescriptions prior to admission   No Known Allergies  Social History  Substance Use Topics  . Smoking status: Never Smoker   . Smokeless tobacco: Not on file  . Alcohol Use: Yes      Comment: very rare    No family history on file.   Review of Systems  Musculoskeletal: Positive for joint pain.  All other systems reviewed and are negative.   Objective:  Physical Exam  Constitutional: She is oriented to person, place, and time. She appears well-developed and well-nourished.  HENT:  Head: Normocephalic and atraumatic.  Eyes: EOM are normal. Pupils are equal, round, and reactive to light.  Neck: Normal range of motion. Neck supple.  Cardiovascular: Normal rate and regular rhythm.   Respiratory: Effort normal and breath sounds normal.  GI: Soft. Bowel sounds are normal.  Musculoskeletal:       Right knee: She exhibits decreased range of motion, swelling, effusion and abnormal alignment. Tenderness found. Medial joint line and lateral joint line tenderness noted.  Neurological: She is alert and oriented to person, place, and time.  Skin: Skin is warm and dry.  Psychiatric: She has a normal mood and affect.    Vital signs in last 24 hours: Temp:  [98.5 F (36.9 C)] 98.5 F (36.9 C) (05/25 0844) Pulse Rate:  [105] 105 (05/25 0859) BP: (185)/(89) 185/89 mmHg (05/25 0859) SpO2:  [100 %] 100 % (05/25 0844) Weight:  [109.77 kg (242 lb)] 109.77 kg (242 lb) (05/25 0841)  Labs:   Estimated body mass index is 41.66 kg/(m^2) as calculated from the following:   Height as of 06/23/15: 5\' 6"  (1.676 m).   Weight as of 06/22/15: 117.028 kg (258 lb).   Imaging Review Plain radiographs demonstrate severe degenerative joint disease of the right knee(s). The overall alignment ismild varus. The bone quality appears to be good for  age and reported activity level.  Assessment/Plan:  End stage arthritis, right knee   The patient history, physical examination, clinical judgment of the provider and imaging studies are consistent with end stage degenerative joint disease of the right knee(s) and total knee arthroplasty is deemed medically necessary. The treatment options  including medical management, injection therapy arthroscopy and arthroplasty were discussed at length. The risks and benefits of total knee arthroplasty were presented and reviewed. The risks due to aseptic loosening, infection, stiffness, patella tracking problems, thromboembolic complications and other imponderables were discussed. The patient acknowledged the explanation, agreed to proceed with the plan and consent was signed. Patient is being admitted for inpatient treatment for surgery, pain control, PT, OT, prophylactic antibiotics, VTE prophylaxis, progressive ambulation and ADL's and discharge planning. The patient is planning to be discharged home with home health services

## 2015-09-01 NOTE — Progress Notes (Signed)
X-ray results noted 

## 2015-09-02 LAB — CBC
HCT: 34.1 % — ABNORMAL LOW (ref 36.0–46.0)
HEMOGLOBIN: 11.3 g/dL — AB (ref 12.0–15.0)
MCH: 29.7 pg (ref 26.0–34.0)
MCHC: 33.1 g/dL (ref 30.0–36.0)
MCV: 89.5 fL (ref 78.0–100.0)
PLATELETS: 240 10*3/uL (ref 150–400)
RBC: 3.81 MIL/uL — AB (ref 3.87–5.11)
RDW: 14.7 % (ref 11.5–15.5)
WBC: 9.4 10*3/uL (ref 4.0–10.5)

## 2015-09-02 LAB — BASIC METABOLIC PANEL
Anion gap: 6 (ref 5–15)
BUN: 11 mg/dL (ref 6–20)
CHLORIDE: 106 mmol/L (ref 101–111)
CO2: 24 mmol/L (ref 22–32)
CREATININE: 0.85 mg/dL (ref 0.44–1.00)
Calcium: 8.3 mg/dL — ABNORMAL LOW (ref 8.9–10.3)
GFR calc Af Amer: >60 mL/min (ref >60)
GFR calc non Af Amer: >60 mL/min (ref >60)
GLUCOSE: 123 mg/dL — AB (ref 65–99)
POTASSIUM: 3.8 mmol/L (ref 3.5–5.1)
SODIUM: 136 mmol/L (ref 135–145)

## 2015-09-02 MED ORDER — RIVAROXABAN 10 MG PO TABS: 10.0000 mg | ORAL_TABLET | Freq: Every day | ORAL | Status: DC

## 2015-09-02 NOTE — Care Management Note (Addendum)
Case Management Note  Patient Details  Name: Nancy [name] MRN: [id number] Date of Birth: [birth year]  Subjective/Objective:      Status post total right knee replacement              Action/Plan: Discharge Planning: AVS reviewed:  NCM spoke to pt and states she is scheduled to [facility] on Tues at 2 pm. States her husband, Nadine CountsBob will be at home to assist with her care. She has crutches and a cane for ambulation. [place]. Contacted [facility], fax [phone]. Will fax orders, dc summary and PT notes.  Expected Discharge Date:  09/02/2015               Expected Discharge Plan:  Home/Self Care  In-House Referral:  NA  Discharge planning Services  CM Consult  Post Acute Care Choice:  NA Choice offered to:  NA  DME Arranged:  N/A DME Agency:  NA  HH Arranged:  NA HH Agency:  NA  Status of Service:  Completed, signed off  Medicare Important Message Given:    Date Medicare IM Given:    Medicare IM give by:    Date Additional Medicare IM Given:    Additional Medicare Important Message give by:     If discussed at Long Length of Stay Meetings, dates discussed:    Additional Comments:  Elliot CousinShavis, Taccara Bushnell Ellen, RN 09/02/2015, 9:26 AM

## 2015-09-02 NOTE — Progress Notes (Signed)
Physical Therapy Treatment Patient Details Name: Adalberto ColeSheila Bryson-Eckroade MRN: [id number] DOB: 1955-08-10 Today's Date: 09/02/2015    History of Present Illness Pt s/p R TKR with hx of L THR (3/17)    PT Comments    Pt very motivated and progressing well with mobility.   Follow Up Recommendations  Outpatient PT     Equipment Recommendations  None recommended by PT    Recommendations for Other Services       Precautions / Restrictions Precautions Precautions: Knee;Fall Required Braces or Orthoses: Knee Immobilizer - Right Knee Immobilizer - Right: Discontinue once straight leg raise with < 10 degree lag Restrictions Weight Bearing Restrictions: No Other Position/Activity Restrictions: WBAT    Mobility  Bed Mobility               General bed mobility comments: OOB and declines back to bed  Transfers Overall transfer level: Needs assistance Equipment used: None Transfers: Sit to/from Stand Sit to Stand: Min guard;Supervision         General transfer comment: min cues for LE management  Ambulation/Gait Ambulation/Gait assistance: Min guard;Supervision Ambulation Distance (Feet): 150 Feet Assistive device: Crutches Gait Pattern/deviations: Step-to pattern;Step-through pattern;Decreased step length - left;Shuffle;Trunk flexed Gait velocity: decr   General Gait Details: cues for lapses in sequence and foot placement   Stairs            Wheelchair Mobility    Modified Rankin (Stroke Patients Only)       Balance                                    Cognition Arousal/Alertness: Awake/alert Behavior During Therapy: WFL for tasks assessed/performed Overall Cognitive Status: Within Functional Limits for tasks assessed                      Exercises Total Joint Exercises Ankle Circles/Pumps: AROM;Both;10 reps;Supine Quad Sets: AROM;Both;15 reps;Supine Heel Slides: AAROM;Right;Supine;20 reps Straight Leg Raises:  AAROM;Right;20 reps;Supine    General Comments        Pertinent Vitals/Pain Pain Assessment: 0-10 Pain Score: 4  Pain Location: R knee Pain Descriptors / Indicators: Aching;Sore Pain Intervention(s): Limited activity within patient's tolerance;Monitored during session;Premedicated before session;Ice applied    Home Living                      Prior Function            PT Goals (current goals can now be found in the care plan section) Acute Rehab PT Goals Patient Stated Goal: Regain IND PT Goal Formulation: With patient Time For Goal Achievement: 09/04/15 Potential to Achieve Goals: Good Progress towards PT goals: Progressing toward goals    Frequency  7X/week    PT Plan Current plan remains appropriate    Co-evaluation             End of Session Equipment Utilized During Treatment: Gait belt Activity Tolerance: Patient tolerated treatment well Patient left: in chair;with call bell/phone within reach;with chair alarm set     Time: [id number] PT Time Calculation (min) (ACUTE ONLY): 30 min  Charges:  $Gait Training: 8-22 mins $Therapeutic Exercise: 8-22 mins                    G Codes:      Londyn Hotard 09/02/2015, 11:59 AM

## 2015-09-02 NOTE — Progress Notes (Signed)
OT Cancellation Note  Patient Details Name: Nancy ColeSheila [name] MRN: [id number] DOB: [birth year]   Cancelled Treatment:    Reason Eval/Treat Not Completed: OT screened, no needs identified, will sign off  Nancy [name] 09/02/2015, 8:27 AM  Nancy OtterMaryellen Nickol [name], OTR/L [phone] 09/02/2015

## 2015-09-02 NOTE — Op Note (Signed)
NAMMarland Kitchen:  Nancy [name], Rielle      ACCOUNT NO.:  [id number]  MEDICAL RECORD NO.:  [id number]  LOCATION:  1620                         [facility]  PHYSICIAN:  Vanita PandaChristopher Y. Magnus IvanBlackman, M.D.DATE OF BIRTH:  [birth year]  DATE OF PROCEDURE:  09/01/2015 DATE OF DISCHARGE:                              OPERATIVE REPORT   PREOPERATIVE DIAGNOSIS:  Primary osteoarthritis and degenerative joint disease with varus deformity, right knee.  POSTOPERATIVE DIAGNOSIS:  Primary osteoarthritis and degenerative joint disease with varus deformity, right knee.  PROCEDURE:  Right total knee arthroplasty.  IMPLANTS:  Stryker Triathlon knee with size 3 femur, size 3 tibial tray, 13-mm fix-bearing polyethylene insert, size 29 patellar button.  SURGEON:  Vanita PandaChristopher Y. Magnus IvanBlackman, M.D.  ASSISTANT:  Richardean CanalGilbert Clark, PA-C.  ANESTHESIA:  Spinal.  TOURNIQUET TIME:  1 hour.  ANTIBIOTICS:  2 g of IV Ancef.  BLOOD LOSS:  Less than 200 mL.  COMPLICATIONS:  None.  INDICATIONS:  [name] is a 60 year old female, well known to me.  She has arthritis in multiple joints and has undergone successful hip replacement before.  Both knees show severe debilitating arthritis on x-ray.  She has a significant varus deformity with complete loss of medial joint space and periarticular osteophytes throughout the knee. She has tried and failed all forms of conservative treatment and due to her daily pain, her decreased mobility and decreased quality of life, she does wish to proceed with a total knee arthroplasty.  She understands the risk of acute blood loss anemia, nerve and vessel injury, fracture, infection, prosthetic loosening, DVT.  She understands our goals are decreased pain, improved mobility and overall improved quality of life.  PROCEDURE DESCRIPTION:  After informed consent was obtained, appropriate right knee was marked.  She was brought to the operating room.  Spinal anesthesia was obtained  while she was on her stretcher.  She was then laid in a supine position on the operating table.  Foley catheter was placed.  Then, a nonsterile tourniquet was placed around her upper right thigh.  Her right leg was then prepped and draped from the thigh down the ankle with DuraPrep and sterile drapes including a sterile stockinette.  Time-out was called and she was identified as correct patient and correct right knee.  We then used an Esmarch to wrap out the leg and tourniquet was inflated to 300 mm of pressure.  We then made a midline incision over the patella and carried this proximally and distally.  We dissected down the knee joint and carried out a medial parapatellar arthrotomy and found significant synovitis of the knee and large fluid collection consistent with synovium and synovitis from an effusion.  After a medial parapatellar arthrotomy, we removed osteophytes throughout the knee as well as remnants of the ACL, PCL, medial and lateral meniscus.  With the knee in a flexed position using the extramedullary cutting guide for the tibia, we made our tibia cut, taking 9 mm off the high side correcting for varus and valgus and a neutral slope.  We made this cut without difficulty.  We then went for an intramedullary guide through the notch on the femur making our distal femoral cut at 10 mm for a 5-degree externally-rotated distal femoral cutting block.  We made this without difficulty and brought the knee back down in extension and actually with a 9-mm extension block, she actually hyperextended, we went up to a 13-mm block and thus, we were pleased with her stability.  We then went back to the femur and set our femoral sizing guide based off the epicondylar axis in 3 degrees right. We chose a size 3 femur.  We then put our 4-in-1 cutting block for a size 3 femur.  We made our anterior and posterior cuts followed by our chamfer cuts.  We then made our femoral box cut based off the  size 3 onto the tibia and trialed this for a size 3 tibial tray.  We made our keel punch off this and with the trial 3 femur and the trial 3 tibial tray, we placed a 13-mm fix-bearing polyethylene insert trial and we were pleased with range of motion and stability.  We then made our patellar cut and drilled 3 holes for a size 29 patellar button.  We then removed all trial components and irrigated the knee with normal saline solution using pulsatile lavage.  We mixed our cement and cemented the real Stryker Triathlon tibial tray size 3, followed by the real size 3 femur.  We cleaned the cement debris from the knee and placed the 13-mm fix-bearing polyethylene insert and cemented our 29 patellar button. Once the cement had hardened with the tourniquet down, hemostasis was obtained with electrocautery, we then irrigated the knee again with normal saline solution using pulsatile lavage.  We closed the arthrotomy with interrupted #1 Vicryl suture followed by 0 Vicryl in the deep tissue, 2-0 Vicryl in the subcutaneous tissue and interrupted staples on the skin.  Well-padded sterile dressing was applied.  She was taken to the recovery room in stable condition.  All final counts were correct. There were no complications noted.  Of note, Richardean Canal, PA-C assisted in the entire case.  His assistance was crucial for facilitating all aspects of this case.     Vanita Panda. Magnus Ivan, M.D.     CYB/MEDQ  D:  09/01/2015  T:  09/02/2015  Job:  [id number]

## 2015-09-02 NOTE — Progress Notes (Signed)
Discharged instructions reviewed with patient utilizing teach back method. No questions at this time  Patient being discharged to home.

## 2015-09-02 NOTE — Discharge Instructions (Signed)
You can increase your activities as comfort allows. Expect knee swelling - ice and elevation as needed. Work on knee motion. You can get your current dressing wet in the shower daily and change as needed.

## 2015-09-02 NOTE — Progress Notes (Signed)
Subjective: 1 Day Post-Op Procedure(s) (LRB): RIGHT TOTAL KNEE ARTHROPLASTY (Right) Patient reports pain as moderate.  Doing great.  Objective: Vital signs in last 24 hours: Temp:  [97.3 F (36.3 C)-99.9 F (37.7 C)] 99.9 F (37.7 C) (05/27 0440) Pulse Rate:  [66-96] 87 (05/27 0440) Resp:  [12-20] 16 (05/27 0440) BP: (122-160)/(57-93) 122/57 mmHg (05/27 0440) SpO2:  [97 %-100 %] 97 % (05/27 0440) Weight:  [109.77 kg (242 lb)] 109.77 kg (242 lb) (05/26 1300)  Intake/Output from previous day: 05/26 0701 - 05/27 0700 In: 3357.5 [P.O.:1080; I.V.:2062.5; IV Piggyback:215] Out: 1000 [Urine:950; Blood:50] Intake/Output this shift: Total I/O In: -  Out: 400 [Urine:400]   Recent Labs  08/31/15 0910 09/02/15 0429  HGB 13.7 11.3*    Recent Labs  08/31/15 0910 09/02/15 0429  WBC 7.4 9.4  RBC 4.70 3.81*  HCT 42.2 34.1*  PLT 271 240    Recent Labs  09/02/15 0429  NA 136  K 3.8  CL 106  CO2 24  BUN 11  CREATININE 0.85  GLUCOSE 123*  CALCIUM 8.3*   No results for input(s): LABPT, INR in the last 72 hours.  Sensation intact distally Intact pulses distally Dorsiflexion/Plantar flexion intact Incision: no drainage No cellulitis present Compartment soft  Assessment/Plan: 1 Day Post-Op Procedure(s) (LRB): RIGHT TOTAL KNEE ARTHROPLASTY (Right) Up with therapy  Discharge to home today  Kathryne HitchBLACKMAN,Laquitta Dominski Y 09/02/2015, 8:59 AM

## 2015-09-02 NOTE — Discharge Summary (Signed)
Patient ID: Nancy [name] MRN: [id number] DOB/AGE: Apr 05, 1956 60 y.o.  Admit date: 09/01/2015 Discharge date: 09/02/2015  Admission Diagnoses:  Principal Problem:   Osteoarthritis of right knee Active Problems:   Status post total right knee replacement   Discharge Diagnoses:  Same  Past Medical History  Diagnosis Date  . Arthritis   . Medical history non-contributory   . Hypertension     "white coat syndrome"- no meds    Surgeries: Procedure(s): RIGHT TOTAL KNEE ARTHROPLASTY on 09/01/2015   Consultants:    Discharged Condition: Improved  Hospital Course: Nancy [name] is an 60 y.o. female who was admitted 09/01/2015 for operative treatment ofOsteoarthritis of right knee. Patient has severe unremitting pain that affects sleep, daily activities, and work/hobbies. After pre-op clearance the patient was taken to the operating room on 09/01/2015 and underwent  Procedure(s): RIGHT TOTAL KNEE ARTHROPLASTY.    Patient was given perioperative antibiotics: Anti-infectives    Start     Dose/Rate Route Frequency Ordered Stop   09/01/15 1600  ceFAZolin (ANCEF) IVPB 1 g/50 mL premix     1 g 100 mL/hr over 30 Minutes Intravenous Every 6 hours 09/01/15 1257 09/01/15 2236   09/01/15 0728  ceFAZolin (ANCEF) IVPB 2g/100 mL premix     2 g 200 mL/hr over 30 Minutes Intravenous On call to O.R. 09/01/15 0728 09/01/15 0930       Patient was given sequential compression devices, early ambulation, and chemoprophylaxis to prevent DVT.  Patient benefited maximally from hospital stay and there were no complications.    Recent vital signs: Patient Vitals for the past 24 hrs:  BP Temp Temp src Pulse Resp SpO2 Height Weight  09/02/15 0440 (!) 122/57 mmHg 99.9 F (37.7 C) Oral 87 16 97 % - -  09/02/15 0203 136/65 mmHg 99.1 F (37.3 C) Oral 96 16 98 % - -  09/01/15 2146 133/65 mmHg 99.3 F (37.4 C) Oral 83 16 100 % - -  09/01/15 1825 (!) 145/70 mmHg 98.5 F (36.9 C) Oral 84 18  100 % - -  09/01/15 1618 132/72 mmHg 98 F (36.7 C) Oral 84 18 100 % - -  09/01/15 1455 124/66 mmHg - - 81 20 100 % - -  09/01/15 1400 127/62 mmHg 97.4 F (36.3 C) - 66 20 100 % - -  09/01/15 1300 (!) 153/79 mmHg 97.3 F (36.3 C) Axillary 86 20 100 %  (1.676 m) 109.77 kg (242 lb)  09/01/15 1259 (!) 153/79 mmHg 97.3 F (36.3 C) - 86 16 100 % - -  09/01/15 1230 (!) 160/93 mmHg 97.8 F (36.6 C) - 84 18 99 % - -  09/01/15 1216 (!) 158/87 mmHg - - 90 17 100 % - -  09/01/15 1215 (!) 158/87 mmHg - - 91 17 100 % - -  09/01/15 1200 126/82 mmHg - - 82 12 100 % - -  09/01/15 1145 124/71 mmHg - - 80 16 98 % - -  09/01/15 1139 122/72 mmHg 97.4 F (36.3 C) - 82 15 100 % - -     Recent laboratory studies:  Recent Labs  08/31/15 0910 09/02/15 0429  WBC 7.4 9.4  HGB 13.7 11.3*  HCT 42.2 34.1*  PLT 271 240  NA  --  136  K  --  3.8  CL  --  106  CO2  --  24  BUN  --  11  CREATININE  --  0.85  GLUCOSE  --  123*  CALCIUM  --  8.3*     Discharge Medications:     Medication List    TAKE these medications        acetaminophen 500 MG tablet  Commonly known as:  TYLENOL  Take 1,000 mg by mouth 2 (two) times daily.     rivaroxaban 10 MG Tabs tablet  Commonly known as:  XARELTO  Take 1 tablet (10 mg total) by mouth daily with breakfast.        Diagnostic Studies: Dg Knee Right Port  09/01/2015  CLINICAL DATA:  Status post right total knee replacement. EXAM: PORTABLE RIGHT KNEE - 1-2 VIEW COMPARISON:  None. FINDINGS: Right total knee prosthesis in satisfactory position and alignment. No fracture or dislocation. Postoperative soft tissue air and skin clips. IMPRESSION: Satisfactory postoperative appearance of a right total knee prosthesis. Electronically Signed   By: Beckie SaltsSteven  Reid M.D.   On: 09/01/2015 12:14    Disposition:  To home      Discharge Instructions    Call MD / Call 911    Complete by:  As directed   If you experience chest pain or shortness of breath, CALL 911  and be transported to the hospital emergency room.  If you develope a fever above 101 F, pus (white drainage) or increased drainage or redness at the wound, or calf pain, call your surgeon's office.     Constipation Prevention    Complete by:  As directed   Drink plenty of fluids.  Prune juice may be helpful.  You may use a stool softener, such as Colace (over the counter) 100 mg twice a day.  Use MiraLax (over the counter) for constipation as needed.     Diet - low sodium heart healthy    Complete by:  As directed      Discharge patient    Complete by:  As directed      Increase activity slowly as tolerated    Complete by:  As directed            Follow-up Information    Follow up with Kathryne HitchBLACKMAN,Dianne Bady Y, MD In 2 weeks.   Specialty:  Orthopedic Surgery   Contact information:   [address] [place] [address] [phone]        Signed: Kathryne HitchBLACKMAN,Deston Bilyeu Y 09/02/2015, 9:02 AM

## 2016-03-11 ENCOUNTER — Other Ambulatory Visit (INDEPENDENT_AMBULATORY_CARE_PROVIDER_SITE_OTHER): Payer: Self-pay | Admitting: Physician Assistant

## 2016-03-20 ENCOUNTER — Other Ambulatory Visit (HOSPITAL_COMMUNITY): Payer: Self-pay | Admitting: *Deleted

## 2016-03-20 NOTE — Patient Instructions (Signed)
Adalberto ColeSheila Bryson-Eckroade  03/20/2016   Your procedure is scheduled on: 03-22-16  [facility]  [facility] 3rd floor to  Short Stay Center at 700 AM.  Call this number if you have problems the morning of surgery [phone]   Remember: ONLY 1 PERSON MAY GO WITH YOU TO SHORT STAY TO GET  READY MORNING OF YOUR SURGERY.  Do not eat food or drink liquids :After Midnight.     Take these medicines the morning of surgery with A SIP OF WATER: none              You may not have any metal on your body including hair pins and              piercings  Do not wear jewelry, make-up, lotions, powders or perfumes, deodorant             Do not wear nail polish.  Do not shave  48 hours prior to surgery.              Men may shave face and neck.   Do not bring valuables to the hospital. [facility] IS NOT             RESPONSIBLE   FOR VALUABLES.  Contacts, dentures or bridgework may not be worn into surgery.  Leave suitcase in the car. After surgery it may be brought to your room.                Please read over the following fact sheets you were given: _____________________________________________________________________             [facility] - Preparing for [facility] Before surgery, you can play an important role.  Because skin is not sterile, your skin needs to be as free of germs as possible.  You can reduce the number of germs on your skin by washing with CHG (chlorahexidine gluconate) soap before surgery.  CHG is an antiseptic cleaner which kills germs and bonds with the skin to continue killing germs even after washing. Please DO NOT use if you have an allergy to CHG or antibacterial soaps.  If your skin becomes reddened/irritated stop using the CHG and inform your nurse when you arrive at Short Stay. Do not shave (including legs and underarms) for at least 48 hours prior to the first CHG shower.  You may shave your face/neck. Please  follow these instructions carefully:  1.  Shower with CHG Soap the night before surgery and the  morning of Surgery.  2.  If you choose to wash your hair, wash your hair first as usual with your  normal  shampoo.  3.  After you shampoo, rinse your hair and body thoroughly to remove the  shampoo.                           4.  Use CHG as you would any other liquid soap.  You can apply chg directly  to the skin and wash                       Gently with a scrungie or clean washcloth.  5.  Apply the CHG Soap to your body ONLY FROM THE NECK DOWN.   Do not use on face/ open  Wound or open sores. Avoid contact with eyes, ears mouth and genitals (private parts).                       Wash face,  Genitals (private parts) with your normal soap.             6.  Wash thoroughly, paying special attention to the area where your surgery  will be performed.  7.  Thoroughly rinse your body with warm water from the neck down.  8.  DO NOT shower/wash with your normal soap after using and rinsing off  the CHG Soap.                9.  Pat yourself dry with a clean towel.            10.  Wear clean pajamas.            11.  Place clean sheets on your bed the night of your first shower and do not  sleep with pets. Day of Surgery : Do not apply any lotions/deodorants the morning of surgery.  Please wear clean clothes to the hospital/surgery center.  FAILURE TO FOLLOW THESE INSTRUCTIONS MAY RESULT IN THE CANCELLATION OF YOUR SURGERY PATIENT SIGNATURE_________________________________  NURSE SIGNATURE__________________________________  ________________________________________________________________________   Adam Phenix  An incentive spirometer is a tool that can help keep your lungs clear and active. This tool measures how well you are filling your lungs with each breath. Taking long deep breaths may help reverse or decrease the chance of developing breathing (pulmonary) problems  (especially infection) following:  A long period of time when you are unable to move or be active. BEFORE THE PROCEDURE   If the spirometer includes an indicator to show your best effort, your nurse or respiratory therapist will set it to a desired goal.  If possible, sit up straight or lean slightly forward. Try not to slouch.  Hold the incentive spirometer in an upright position. INSTRUCTIONS FOR USE  1. Sit on the edge of your bed if possible, or sit up as far as you can in bed or on a chair. 2. Hold the incentive spirometer in an upright position. 3. Breathe out normally. 4. Place the mouthpiece in your mouth and seal your lips tightly around it. 5. Breathe in slowly and as deeply as possible, raising the piston or the ball toward the top of the column. 6. Hold your breath for 3-5 seconds or for as long as possible. Allow the piston or ball to fall to the bottom of the column. 7. Remove the mouthpiece from your mouth and breathe out normally. 8. Rest for a few seconds and repeat Steps 1 through 7 at least 10 times every 1-2 hours when you are awake. Take your time and take a few normal breaths between deep breaths. 9. The spirometer may include an indicator to show your best effort. Use the indicator as a goal to work toward during each repetition. 10. After each set of 10 deep breaths, practice coughing to be sure your lungs are clear. If you have an incision (the cut made at the time of surgery), support your incision when coughing by placing a pillow or rolled up towels firmly against it. Once you are able to get out of bed, walk around indoors and cough well. You may stop using the incentive spirometer when instructed by your caregiver.  RISKS AND COMPLICATIONS  Take your time so you do not get  dizzy or light-headed.  If you are in pain, you may need to take or ask for pain medication before doing incentive spirometry. It is harder to take a deep breath if you are having  pain. AFTER USE  Rest and breathe slowly and easily.  It can be helpful to keep track of a log of your progress. Your caregiver can provide you with a simple table to help with this. If you are using the spirometer at home, follow these instructions: [place] IF:   You are having difficultly using the spirometer.  You have trouble using the spirometer as often as instructed.  Your pain medication is not giving enough relief while using the spirometer.  You develop fever of 100.5 F (38.1 C) or higher. SEEK IMMEDIATE MEDICAL CARE IF:   You cough up bloody sputum that had not been present before.  You develop fever of 102 F (38.9 C) or greater.  You develop worsening pain at or near the incision site. MAKE SURE YOU:   Understand these instructions.  Will watch your condition.  Will get help right away if you are not doing well or get worse. Document Released: 08/05/2006 Document Revised: 06/17/2011 Document Reviewed: 10/06/2006 [facility] Patient Information 2014 [place], [place].   ________________________________________________________________________

## 2016-03-21 ENCOUNTER — Encounter (HOSPITAL_COMMUNITY): Payer: Self-pay

## 2016-03-21 ENCOUNTER — Encounter (HOSPITAL_COMMUNITY)
Admission: RE | Admit: 2016-03-21 | Discharge: 2016-03-21 | Disposition: A | Discharge status: Home / Self Care | Payer: BLUE CROSS/BLUE SHIELD | Source: Ambulatory Visit | Attending: Orthopaedic Surgery | Admitting: Orthopaedic Surgery

## 2016-03-21 DIAGNOSIS — M170 Bilateral primary osteoarthritis of knee: Secondary | ICD-10-CM | POA: Diagnosis present

## 2016-03-21 DIAGNOSIS — Z01812 Encounter for preprocedural laboratory examination: Secondary | ICD-10-CM | POA: Insufficient documentation

## 2016-03-21 DIAGNOSIS — Z7901 Long term (current) use of anticoagulants: Secondary | ICD-10-CM | POA: Diagnosis not present

## 2016-03-21 DIAGNOSIS — M1712 Unilateral primary osteoarthritis, left knee: Secondary | ICD-10-CM

## 2016-03-21 DIAGNOSIS — M25462 Effusion, left knee: Secondary | ICD-10-CM | POA: Diagnosis not present

## 2016-03-21 DIAGNOSIS — I10 Essential (primary) hypertension: Secondary | ICD-10-CM | POA: Diagnosis not present

## 2016-03-21 DIAGNOSIS — Z96651 Presence of right artificial knee joint: Secondary | ICD-10-CM | POA: Diagnosis not present

## 2016-03-21 DIAGNOSIS — Z96642 Presence of left artificial hip joint: Secondary | ICD-10-CM | POA: Diagnosis not present

## 2016-03-21 LAB — CBC
HEMATOCRIT: 42.7 % (ref 36.0–46.0)
HEMOGLOBIN: 14.7 g/dL (ref 12.0–15.0)
MCH: 29.3 pg (ref 26.0–34.0)
MCHC: 34.4 g/dL (ref 30.0–36.0)
MCV: 85.2 fL (ref 78.0–100.0)
Platelets: 268 10*3/uL (ref 150–400)
RBC: 5.01 MIL/uL (ref 3.87–5.11)
RDW: 14.8 % (ref 11.5–15.5)
WBC: 7.9 10*3/uL (ref 4.0–10.5)

## 2016-03-21 LAB — SURGICAL PCR SCREEN
MRSA, PCR: NEGATIVE
STAPHYLOCOCCUS AUREUS: NEGATIVE

## 2016-03-22 ENCOUNTER — Observation Stay (HOSPITAL_COMMUNITY)
Admission: RE | Admit: 2016-03-22 | Discharge: 2016-03-22 | Disposition: A | Discharge status: Home / Self Care | Payer: BLUE CROSS/BLUE SHIELD | Source: Ambulatory Visit | Attending: Orthopaedic Surgery | Admitting: Orthopaedic Surgery

## 2016-03-22 ENCOUNTER — Inpatient Hospital Stay (HOSPITAL_COMMUNITY): Payer: BLUE CROSS/BLUE SHIELD

## 2016-03-22 ENCOUNTER — Inpatient Hospital Stay (HOSPITAL_COMMUNITY): Payer: BLUE CROSS/BLUE SHIELD | Admitting: Anesthesiology

## 2016-03-22 ENCOUNTER — Encounter (HOSPITAL_COMMUNITY)
Admission: RE | Disposition: A | Discharge status: Home / Self Care | Payer: Self-pay | Source: Ambulatory Visit | Attending: Orthopaedic Surgery

## 2016-03-22 ENCOUNTER — Encounter (HOSPITAL_COMMUNITY): Payer: Self-pay | Admitting: *Deleted

## 2016-03-22 DIAGNOSIS — M170 Bilateral primary osteoarthritis of knee: Principal | ICD-10-CM | POA: Insufficient documentation

## 2016-03-22 DIAGNOSIS — Z96652 Presence of left artificial knee joint: Secondary | ICD-10-CM

## 2016-03-22 DIAGNOSIS — Z96642 Presence of left artificial hip joint: Secondary | ICD-10-CM | POA: Insufficient documentation

## 2016-03-22 DIAGNOSIS — M1712 Unilateral primary osteoarthritis, left knee: Secondary | ICD-10-CM

## 2016-03-22 DIAGNOSIS — I10 Essential (primary) hypertension: Secondary | ICD-10-CM | POA: Insufficient documentation

## 2016-03-22 DIAGNOSIS — M25462 Effusion, left knee: Secondary | ICD-10-CM | POA: Insufficient documentation

## 2016-03-22 DIAGNOSIS — Z7901 Long term (current) use of anticoagulants: Secondary | ICD-10-CM | POA: Insufficient documentation

## 2016-03-22 DIAGNOSIS — Z96651 Presence of right artificial knee joint: Secondary | ICD-10-CM | POA: Insufficient documentation

## 2016-03-22 HISTORY — PX: TOTAL KNEE ARTHROPLASTY: SHX125

## 2016-03-22 SURGERY — ARTHROPLASTY, KNEE, TOTAL
Anesthesia: Monitor Anesthesia Care | Site: Knee | Laterality: Left

## 2016-03-22 MED ORDER — POLYETHYLENE GLYCOL 3350 17 G PO PACK: 17.0000 g | PACK | Freq: Every day | ORAL | Status: DC | PRN

## 2016-03-22 MED ORDER — FENTANYL CITRATE (PF) 100 MCG/2ML IJ SOLN
50.0000 ug | INTRAMUSCULAR | Status: DC | PRN
  Administered 2016-03-22: 100 ug via INTRAVENOUS

## 2016-03-22 MED ORDER — TRANEXAMIC ACID 1000 MG/10ML IV SOLN
1000.0000 mg | INTRAVENOUS | Status: AC
  Administered 2016-03-22: 1000 mg via INTRAVENOUS
  Filled 2016-03-22: qty 1100

## 2016-03-22 MED ORDER — ROPIVACAINE HCL 7.5 MG/ML IJ SOLN
INTRAMUSCULAR | Status: AC
  Filled 2016-03-22: qty 20

## 2016-03-22 MED ORDER — METHOCARBAMOL 500 MG PO TABS: 500.0000 mg | ORAL_TABLET | Freq: Four times a day (QID) | ORAL | Status: DC | PRN

## 2016-03-22 MED ORDER — ACETAMINOPHEN 650 MG RE SUPP: 650.0000 mg | Freq: Four times a day (QID) | RECTAL | Status: DC | PRN

## 2016-03-22 MED ORDER — FENTANYL CITRATE (PF) 100 MCG/2ML IJ SOLN: 25.0000 ug | INTRAMUSCULAR | Status: DC | PRN

## 2016-03-22 MED ORDER — LIDOCAINE 2% (20 MG/ML) 5 ML SYRINGE
INTRAMUSCULAR | Status: AC
  Filled 2016-03-22: qty 5

## 2016-03-22 MED ORDER — ZOLPIDEM TARTRATE 5 MG PO TABS: 5.0000 mg | ORAL_TABLET | Freq: Every evening | ORAL | Status: DC | PRN

## 2016-03-22 MED ORDER — LIDOCAINE 2% (20 MG/ML) 5 ML SYRINGE
INTRAMUSCULAR | Status: DC | PRN
  Administered 2016-03-22: 100 mg via INTRAVENOUS

## 2016-03-22 MED ORDER — SUCCINYLCHOLINE CHLORIDE 200 MG/10ML IV SOSY
PREFILLED_SYRINGE | INTRAVENOUS | Status: AC
  Filled 2016-03-22: qty 10

## 2016-03-22 MED ORDER — CHLORHEXIDINE GLUCONATE 4 % EX LIQD: 60.0000 mL | Freq: Once | CUTANEOUS | Status: DC

## 2016-03-22 MED ORDER — ONDANSETRON HCL 4 MG/2ML IJ SOLN: 4.0000 mg | Freq: Four times a day (QID) | INTRAMUSCULAR | Status: DC | PRN

## 2016-03-22 MED ORDER — STERILE WATER FOR IRRIGATION IR SOLN
Status: DC | PRN
Start: 2016-03-22 — End: 2016-03-22
  Administered 2016-03-22: 3000 mL

## 2016-03-22 MED ORDER — OXYCODONE HCL 5 MG PO TABS: 5.0000 mg | ORAL_TABLET | ORAL | Status: DC | PRN

## 2016-03-22 MED ORDER — CEFAZOLIN SODIUM-DEXTROSE 2-4 GM/100ML-% IV SOLN
INTRAVENOUS | Status: AC
  Filled 2016-03-22: qty 100

## 2016-03-22 MED ORDER — LACTATED RINGERS IV SOLN
INTRAVENOUS | Status: DC | PRN
  Administered 2016-03-22 (×2): via INTRAVENOUS

## 2016-03-22 MED ORDER — PROPOFOL 10 MG/ML IV BOLUS
INTRAVENOUS | Status: AC
  Filled 2016-03-22: qty 20

## 2016-03-22 MED ORDER — PROPOFOL 10 MG/ML IV BOLUS
INTRAVENOUS | Status: DC | PRN
  Administered 2016-03-22 (×2): 20 mg via INTRAVENOUS

## 2016-03-22 MED ORDER — METOCLOPRAMIDE HCL 5 MG PO TABS: 5.0000 mg | ORAL_TABLET | Freq: Three times a day (TID) | ORAL | Status: DC | PRN

## 2016-03-22 MED ORDER — DEXTROSE 5 % IV SOLN
500.0000 mg | Freq: Four times a day (QID) | INTRAVENOUS | Status: DC | PRN
  Administered 2016-03-22: 500 mg via INTRAVENOUS
  Filled 2016-03-22: qty 5
  Filled 2016-03-22: qty 550

## 2016-03-22 MED ORDER — ONDANSETRON HCL 4 MG/2ML IJ SOLN
INTRAMUSCULAR | Status: DC | PRN
  Administered 2016-03-22: 4 mg via INTRAVENOUS

## 2016-03-22 MED ORDER — MIDAZOLAM HCL 2 MG/2ML IJ SOLN
1.0000 mg | INTRAMUSCULAR | Status: DC | PRN
  Administered 2016-03-22: 2 mg via INTRAVENOUS
  Filled 2016-03-22: qty 2

## 2016-03-22 MED ORDER — PHENYLEPHRINE HCL 10 MG/ML IJ SOLN
INTRAVENOUS | Status: DC | PRN
  Administered 2016-03-22: 50 ug/min via INTRAVENOUS

## 2016-03-22 MED ORDER — DIPHENHYDRAMINE HCL 12.5 MG/5ML PO ELIX: 12.5000 mg | ORAL_SOLUTION | ORAL | Status: DC | PRN

## 2016-03-22 MED ORDER — FENTANYL CITRATE (PF) 100 MCG/2ML IJ SOLN
INTRAMUSCULAR | Status: AC
  Filled 2016-03-22: qty 2

## 2016-03-22 MED ORDER — MIDAZOLAM HCL 5 MG/5ML IJ SOLN
INTRAMUSCULAR | Status: DC | PRN
  Administered 2016-03-22: 1 mg via INTRAVENOUS

## 2016-03-22 MED ORDER — BUPIVACAINE IN DEXTROSE 0.75-8.25 % IT SOLN
INTRATHECAL | Status: DC | PRN
  Administered 2016-03-22: 2 mL via INTRATHECAL

## 2016-03-22 MED ORDER — PROPOFOL 500 MG/50ML IV EMUL
INTRAVENOUS | Status: DC | PRN
  Administered 2016-03-22: 140 ug/kg/min via INTRAVENOUS

## 2016-03-22 MED ORDER — HYDROMORPHONE HCL 2 MG/ML IJ SOLN
1.0000 mg | INTRAMUSCULAR | Status: DC | PRN
Start: 2016-03-22 — End: 2016-03-22

## 2016-03-22 MED ORDER — ACETAMINOPHEN 325 MG PO TABS: 650.0000 mg | ORAL_TABLET | Freq: Four times a day (QID) | ORAL | Status: DC | PRN

## 2016-03-22 MED ORDER — RIVAROXABAN 10 MG PO TABS: 10.0000 mg | ORAL_TABLET | Freq: Every day | ORAL | Status: DC

## 2016-03-22 MED ORDER — PROMETHAZINE HCL 25 MG/ML IJ SOLN: 6.2500 mg | INTRAMUSCULAR | Status: DC | PRN

## 2016-03-22 MED ORDER — CEFAZOLIN IN D5W 1 GM/50ML IV SOLN
1.0000 g | Freq: Four times a day (QID) | INTRAVENOUS | Status: DC
  Administered 2016-03-22: 1 g via INTRAVENOUS
  Filled 2016-03-22 (×2): qty 50

## 2016-03-22 MED ORDER — FENTANYL CITRATE (PF) 100 MCG/2ML IJ SOLN
INTRAMUSCULAR | Status: DC | PRN
  Administered 2016-03-22: 50 ug via INTRAVENOUS

## 2016-03-22 MED ORDER — ROPIVACAINE HCL 7.5 MG/ML IJ SOLN
INTRAMUSCULAR | Status: DC | PRN
  Administered 2016-03-22: 20 mL via PERINEURAL

## 2016-03-22 MED ORDER — MENTHOL 3 MG MT LOZG: 1.0000 | LOZENGE | OROMUCOSAL | Status: DC | PRN

## 2016-03-22 MED ORDER — SODIUM CHLORIDE 0.9 % IV SOLN: INTRAVENOUS | Status: DC

## 2016-03-22 MED ORDER — PHENYLEPHRINE HCL 10 MG/ML IJ SOLN
INTRAMUSCULAR | Status: AC
  Filled 2016-03-22: qty 1

## 2016-03-22 MED ORDER — PROPOFOL 10 MG/ML IV BOLUS
INTRAVENOUS | Status: AC
  Filled 2016-03-22: qty 60

## 2016-03-22 MED ORDER — 0.9 % SODIUM CHLORIDE (POUR BTL) OPTIME
TOPICAL | Status: DC | PRN
  Administered 2016-03-22: 1000 mL

## 2016-03-22 MED ORDER — KETOROLAC TROMETHAMINE 15 MG/ML IJ SOLN
INTRAMUSCULAR | Status: AC
  Filled 2016-03-22: qty 1

## 2016-03-22 MED ORDER — ONDANSETRON HCL 4 MG/2ML IJ SOLN
INTRAMUSCULAR | Status: AC
  Filled 2016-03-22: qty 2

## 2016-03-22 MED ORDER — PHENOL 1.4 % MT LIQD: 1.0000 | OROMUCOSAL | Status: DC | PRN

## 2016-03-22 MED ORDER — ONDANSETRON HCL 4 MG PO TABS: 4.0000 mg | ORAL_TABLET | Freq: Four times a day (QID) | ORAL | Status: DC | PRN

## 2016-03-22 MED ORDER — CEFAZOLIN SODIUM-DEXTROSE 2-4 GM/100ML-% IV SOLN
2.0000 g | INTRAVENOUS | Status: AC
  Administered 2016-03-22: 2 g via INTRAVENOUS
  Filled 2016-03-22: qty 100

## 2016-03-22 MED ORDER — METOCLOPRAMIDE HCL 5 MG/ML IJ SOLN: 5.0000 mg | Freq: Three times a day (TID) | INTRAMUSCULAR | Status: DC | PRN

## 2016-03-22 MED ORDER — SODIUM CHLORIDE 0.9 % IR SOLN
Status: DC | PRN
  Administered 2016-03-22: 3000 mL

## 2016-03-22 MED ORDER — ALUM & MAG HYDROXIDE-SIMETH 200-200-20 MG/5ML PO SUSP: 30.0000 mL | ORAL | Status: DC | PRN

## 2016-03-22 MED ORDER — MIDAZOLAM HCL 2 MG/2ML IJ SOLN
INTRAMUSCULAR | Status: AC
  Filled 2016-03-22: qty 2

## 2016-03-22 MED ORDER — KETOROLAC TROMETHAMINE 15 MG/ML IJ SOLN
7.5000 mg | Freq: Four times a day (QID) | INTRAMUSCULAR | Status: DC
  Administered 2016-03-22: 7.5 mg via INTRAVENOUS

## 2016-03-22 MED ORDER — DEXAMETHASONE SODIUM PHOSPHATE 10 MG/ML IJ SOLN
INTRAMUSCULAR | Status: AC
  Filled 2016-03-22: qty 1

## 2016-03-22 MED ORDER — DOCUSATE SODIUM 100 MG PO CAPS: 100.0000 mg | ORAL_CAPSULE | Freq: Two times a day (BID) | ORAL | Status: DC

## 2016-03-22 SURGICAL SUPPLY — 51 items
BAG ZIPLOCK 12X15 (MISCELLANEOUS) IMPLANT
BANDAGE ACE 6X5 VEL STRL LF (GAUZE/BANDAGES/DRESSINGS) ×6 IMPLANT
BENZOIN TINCTURE PRP APPL 2/3 (GAUZE/BANDAGES/DRESSINGS) ×3 IMPLANT
BLADE SAG 13.0X1.37X90 (BLADE) IMPLANT
BLADE SAG 18X100X1.27 (BLADE) ×3 IMPLANT
BOWL SMART MIX CTS (DISPOSABLE) ×3 IMPLANT
CAPT KNEE TOTAL 3 ×3 IMPLANT
CEMENT BONE SIMPLEX SPEEDSET (Cement) ×6 IMPLANT
CLOSURE WOUND 1/2 X4 (GAUZE/BANDAGES/DRESSINGS) ×1
CLOTH BEACON ORANGE TIMEOUT ST (SAFETY) ×3 IMPLANT
CUFF TOURN SGL QUICK 34 (TOURNIQUET CUFF) ×2
CUFF TRNQT CYL 34X4X40X1 (TOURNIQUET CUFF) ×1 IMPLANT
DRAPE U-SHAPE 47X51 STRL (DRAPES) ×3 IMPLANT
DRSG AQUACEL AG ADV 3.5X10 (GAUZE/BANDAGES/DRESSINGS) ×3 IMPLANT
DRSG PAD ABDOMINAL 8X10 ST (GAUZE/BANDAGES/DRESSINGS) ×3 IMPLANT
DURAPREP 26ML APPLICATOR (WOUND CARE) ×3 IMPLANT
ELECT REM PT RETURN 9FT ADLT (ELECTROSURGICAL) ×3
ELECTRODE REM PT RTRN 9FT ADLT (ELECTROSURGICAL) ×1 IMPLANT
GAUZE SPONGE 4X4 12PLY STRL (GAUZE/BANDAGES/DRESSINGS) ×3 IMPLANT
GAUZE XEROFORM 1X8 LF (GAUZE/BANDAGES/DRESSINGS) IMPLANT
GLOVE BIO SURGEON STRL SZ7.5 (GLOVE) ×3 IMPLANT
GLOVE BIOGEL PI IND STRL 7.0 (GLOVE) ×4 IMPLANT
GLOVE BIOGEL PI IND STRL 8 (GLOVE) ×2 IMPLANT
GLOVE BIOGEL PI INDICATOR 7.0 (GLOVE) ×8
GLOVE BIOGEL PI INDICATOR 8 (GLOVE) ×4
GLOVE ECLIPSE 8.0 STRL XLNG CF (GLOVE) ×3 IMPLANT
GOWN STRL REUS W/TWL XL LVL3 (GOWN DISPOSABLE) ×12 IMPLANT
HANDPIECE INTERPULSE COAX TIP (DISPOSABLE) ×2
IMMOBILIZER KNEE 20 (SOFTGOODS) ×3
IMMOBILIZER KNEE 20 THIGH 36 (SOFTGOODS) ×1 IMPLANT
NS IRRIG 1000ML POUR BTL (IV SOLUTION) ×3 IMPLANT
PACK TOTAL KNEE CUSTOM (KITS) ×3 IMPLANT
PADDING CAST COTTON 6X4 STRL (CAST SUPPLIES) ×3 IMPLANT
POSITIONER SURGICAL ARM (MISCELLANEOUS) ×3 IMPLANT
SET HNDPC FAN SPRY TIP SCT (DISPOSABLE) ×1 IMPLANT
SET PAD KNEE POSITIONER (MISCELLANEOUS) ×3 IMPLANT
STAPLER VISISTAT 35W (STAPLE) IMPLANT
STRIP CLOSURE SKIN 1/2X4 (GAUZE/BANDAGES/DRESSINGS) ×2 IMPLANT
SUCTION FRAZIER HANDLE 12FR (TUBING) ×2
SUCTION TUBE FRAZIER 12FR DISP (TUBING) ×1 IMPLANT
SUT MNCRL AB 4-0 PS2 18 (SUTURE) IMPLANT
SUT VIC AB 0 CT1 27 (SUTURE) ×2
SUT VIC AB 0 CT1 27XBRD ANTBC (SUTURE) ×1 IMPLANT
SUT VIC AB 1 CT1 27 (SUTURE) ×4
SUT VIC AB 1 CT1 27XBRD ANTBC (SUTURE) ×2 IMPLANT
SUT VIC AB 2-0 CT1 27 (SUTURE) ×4
SUT VIC AB 2-0 CT1 TAPERPNT 27 (SUTURE) ×2 IMPLANT
TRAY FOLEY W/METER SILVER 16FR (SET/KITS/TRAYS/PACK) ×3 IMPLANT
WATER STERILE IRR 1500ML POUR (IV SOLUTION) ×3 IMPLANT
WRAP KNEE MAXI GEL POST OP (GAUZE/BANDAGES/DRESSINGS) ×3 IMPLANT
YANKAUER SUCT BULB TIP 10FT TU (MISCELLANEOUS) ×3 IMPLANT

## 2016-03-22 NOTE — Transfer of Care (Signed)
Immediate Anesthesia Transfer of Care Note  Patient: Adalberto ColeSheila Bryson-Eckroade  Procedure(s) Performed: Procedure(s): LEFT TOTAL KNEE ARTHROPLASTY (Left)  Patient Location: PACU  Anesthesia Type:Spinal  Level of Consciousness: awake, alert  and oriented  Airway & Oxygen Therapy: Patient Spontanous Breathing and Patient connected to nasal cannula oxygen  Post-op Assessment: Report given to RN and Post -op Vital signs reviewed and stable  Post vital signs: Reviewed and stable  Last Vitals:  Vitals:   03/22/16 0838 03/22/16 0839  BP:    Pulse: (!) 106 (!) 103  Resp: 15 10  Temp:      Last Pain:  Vitals:   03/22/16 0710  TempSrc: Oral      Patients Stated Pain Goal: 4 (03/22/16 0708)  Complications: No apparent anesthesia complications

## 2016-03-22 NOTE — Progress Notes (Signed)
Patient ID: [name], female   DOB: 1955-08-18, 60 y.o.   MRN: [id number] Doing well.  Can be discharged to home today.

## 2016-03-22 NOTE — Anesthesia Postprocedure Evaluation (Signed)
Anesthesia Post Note  Patient: Nancy ColeSheila [name]  Procedure(s) Performed: Procedure(s) (LRB): LEFT TOTAL KNEE ARTHROPLASTY (Left)  Patient location during evaluation: PACU Anesthesia Type: Spinal, MAC and Regional Level of consciousness: oriented and awake and alert Pain management: pain level controlled Vital Signs Assessment: post-procedure vital signs reviewed and stable Respiratory status: spontaneous breathing, respiratory function stable and patient connected to nasal cannula oxygen Cardiovascular status: blood pressure returned to baseline and stable Postop Assessment: no headache, no backache, spinal receding, no signs of nausea or vomiting and patient able to bend at knees Anesthetic complications: no    Last Vitals:  Vitals:   03/22/16 1426 03/22/16 1532  BP: 140/80 (!) 174/87  Pulse: (!) 106 (!) 117  Resp: 16 18  Temp: 36.8 C 36.8 C    Last Pain:  Vitals:   03/22/16 1532  TempSrc: Oral  PainSc:                  Cecile HearingStephen Edward Turk

## 2016-03-22 NOTE — Progress Notes (Signed)
X-ray results noted 

## 2016-03-22 NOTE — Progress Notes (Signed)
AssistedDr. Turk with left, ultrasound guided, adductor canal block. Side rails up, monitors on throughout procedure. See vital signs in flow sheet. Tolerated Procedure well.  

## 2016-03-22 NOTE — Anesthesia Preprocedure Evaluation (Signed)
Anesthesia Evaluation  Patient identified by MRN, date of birth, ID band Patient awake    Reviewed: Allergy & Precautions, NPO status , Patient's Chart, lab work & pertinent test results  Airway Mallampati: II  TM Distance: >3 FB Neck ROM: Full    Dental  (+) Teeth Intact, Dental Advisory Given   Pulmonary neg pulmonary ROS,    Pulmonary exam normal breath sounds clear to auscultation       Cardiovascular hypertension, Normal cardiovascular exam Rhythm:Regular Rate:Normal     Neuro/Psych negative neurological ROS  negative psych ROS   GI/Hepatic negative GI ROS, Neg liver ROS,   Endo/Other  negative endocrine ROSObesity   Renal/GU negative Renal ROS     Musculoskeletal  (+) Arthritis , Osteoarthritis,    Abdominal   Peds  Hematology negative hematology ROS (+) Plt 268k   Anesthesia Other Findings Day of surgery medications reviewed with the patient.  Reproductive/Obstetrics                             Anesthesia Physical Anesthesia Plan  ASA: II  Anesthesia Plan: Spinal and MAC   Post-op Pain Management:    Induction: Intravenous  Airway Management Planned: Simple Face Mask  Additional Equipment:   Intra-op Plan:   Post-operative Plan:   Informed Consent: I have reviewed the patients History and Physical, chart, labs and discussed the procedure including the risks, benefits and alternatives for the proposed anesthesia with the patient or authorized representative who has indicated his/her understanding and acceptance.   Dental advisory given  Plan Discussed with: CRNA, Anesthesiologist and Surgeon  Anesthesia Plan Comments: (Discussed risks and benefits of and differences between spinal and general. Discussed risks of spinal including headache, backache, failure, bleeding, infection, and nerve damage. Patient consents to spinal. Questions answered. Coagulation studies and  platelet count acceptable.)        Anesthesia Quick Evaluation

## 2016-03-22 NOTE — Anesthesia Procedure Notes (Signed)
Spinal  Patient location during procedure: OR Start time: 03/22/2016 8:58 AM End time: 03/22/2016 9:03 AM Staffing Anesthesiologist: Cecile HearingURK, Donne Baley EDWARD Performed: anesthesiologist  Preanesthetic Checklist Completed: patient identified, surgical consent, pre-op evaluation, timeout performed, IV checked, risks and benefits discussed and monitors and equipment checked Spinal Block Patient position: sitting Prep: site prepped and draped and DuraPrep Patient monitoring: continuous pulse ox and blood pressure Approach: midline Location: L3-4 Injection technique: single-shot Needle Needle type: Pencan  Needle gauge: 25 G Needle length: 9 cm Assessment Sensory level: T8 Additional Notes Functioning IV was confirmed and monitors were applied. Sterile prep and drape, including hand hygiene, mask and sterile gloves were used. The patient was positioned and the spine was prepped. The skin was anesthetized with lidocaine.  Free flow of clear CSF was obtained prior to injecting local anesthetic into the CSF.  The spinal needle aspirated freely following injection.  The needle was carefully withdrawn.  The patient tolerated the procedure well. Consent was obtained prior to procedure with all questions answered and concerns addressed. Risks including but not limited to bleeding, infection, nerve damage, paralysis, failed block, inadequate analgesia, allergic reaction, high spinal, itching and headache were discussed and the patient wished to proceed.   Arrie AranStephen Batoul Limes, MD

## 2016-03-22 NOTE — Brief Op Note (Signed)
03/22/2016  10:40 AM  PATIENT:  Nancy [name]  60 y.o. female  PRE-OPERATIVE DIAGNOSIS:  Severe osteoarthritis left knee  POST-OPERATIVE DIAGNOSIS:  Severe osteoarthritis left knee  PROCEDURE:  Procedure(s): LEFT TOTAL KNEE ARTHROPLASTY (Left)  SURGEON:  Surgeon(s) and Role:    * Kathryne Hitchhristopher Y Corbyn Wildey, MD - Primary  PHYSICIAN ASSISTANT: Rexene EdisonGil Clark, PA-C   ANESTHESIA:   regional and spinal  EBL:  Total I/O In: 1000 [I.V.:1000] Out: 300 [Urine:250; Blood:50]  COUNTS:  YES  TOURNIQUET:   Total Tourniquet Time Documented: Thigh (Left) - 56 minutes Total: Thigh (Left) - 56 minutes   DICTATION: .Other Dictation: Dictation Number [id number]  PLAN OF CARE: Admit to inpatient   PATIENT DISPOSITION:  PACU - hemodynamically stable.   Delay start of Pharmacological VTE agent (>24hrs) due to surgical blood loss or risk of bleeding: no

## 2016-03-22 NOTE — Anesthesia Procedure Notes (Signed)
Anesthesia Regional Block:  Adductor canal block  Pre-Anesthetic Checklist: ,, timeout performed, Correct Patient, Correct Site, Correct Laterality, Correct Procedure, Correct Position, site marked, Risks and benefits discussed,  Surgical consent,  Pre-op evaluation,  At surgeon's request and post-op pain management  Laterality: Left  Prep: chloraprep       Needles:  Injection technique: Single-shot  Needle Type: Echogenic Needle     Needle Length: 9cm 9 cm Needle Gauge: 21 and 21 G    Additional Needles:  Procedures: ultrasound guided (picture in chart) Adductor canal block Narrative:  Start time: 03/22/2016 8:33 AM End time: 03/22/2016 8:43 AM Injection made incrementally with aspirations every 5 mL.  Performed by: Personally  Anesthesiologist: Cecile HearingURK, Raylee Adamec EDWARD  Additional Notes: No pain on injection. No increased resistance to injection. Injection made in 5cc increments.  Good needle visualization.  Patient tolerated procedure well.

## 2016-03-22 NOTE — H&P (Signed)
TOTAL KNEE ADMISSION H&P  Patient is being admitted for left total knee arthroplasty.  Subjective:  Chief Complaint:left knee pain.  HPI: Nancy ColeSheila [name], 60 y.o. female, has a history of pain and functional disability in the left knee due to arthritis and has failed non-surgical conservative treatments for greater than 12 weeks to includeNSAID's and/or analgesics, corticosteriod injections, viscosupplementation injections, flexibility and strengthening excercises, use of assistive devices, weight reduction as appropriate and activity modification.  Onset of symptoms was gradual, starting 5 years ago with gradually worsening course since that time. The patient noted no past surgery on the left knee(s).  Patient currently rates pain in the left knee(s) at 10 out of 10 with activity. Patient has night pain, worsening of pain with activity and weight bearing, pain that interferes with activities of daily living, pain with passive range of motion, crepitus and joint swelling.  Patient has evidence of subchondral sclerosis, periarticular osteophytes and joint space narrowing by imaging studies. There is no active infection.  Patient Active Problem List   Diagnosis Date Noted  . Unilateral primary osteoarthritis, left knee 03/22/2016  . Osteoarthritis of right knee 09/01/2015  . Status post total right knee replacement 09/01/2015  . Osteoarthritis of left hip 06/23/2015  . Status post total replacement of left hip 06/23/2015   Past Medical History:  Diagnosis Date  . Arthritis   . Hypertension    "white coat syndrome"- no meds  . Medical history non-contributory     Past Surgical History:  Procedure Laterality Date  . TOTAL HIP ARTHROPLASTY Left 06/23/2015   Procedure: LEFT TOTAL HIP ARTHROPLASTY ANTERIOR APPROACH;  Surgeon: Kathryne Hitchhristopher Y Anitra Doxtater, MD;  Location: [facility];  Service: Orthopedics;  Laterality: Left;  . TOTAL KNEE ARTHROPLASTY Right 09/01/2015   Procedure: RIGHT TOTAL KNEE  ARTHROPLASTY;  Surgeon: Kathryne Hitchhristopher Y Aveion Nguyen, MD;  Location: [facility];  Service: Orthopedics;  Laterality: Right;    Prescriptions Prior to Admission  Medication Sig Dispense Refill Last Dose  . acetaminophen (TYLENOL) 500 MG tablet Take 1,000 mg by mouth every 6 (six) hours as needed for mild pain.   Past Month at Unknown time  . acetaminophen (TYLENOL) 500 MG tablet Take 1,000 mg by mouth 2 (two) times daily.   08/31/2015 at 2100  . rivaroxaban (XARELTO) 10 MG TABS tablet Take 1 tablet (10 mg total) by mouth daily with breakfast. (Patient not taking: Reported on 03/18/2016) 20 tablet 0 Not Taking at Unknown time   No Known Allergies  Social History  Substance Use Topics  . Smoking status: Never Smoker  . Smokeless tobacco: Never Used  . Alcohol use Yes     Comment: very rare    History reviewed. No pertinent family history.   Review of Systems  Musculoskeletal: Positive for joint pain.  All other systems reviewed and are negative.   Objective:  Physical Exam  Constitutional: She is oriented to person, place, and time. She appears well-developed and well-nourished.  HENT:  Head: Normocephalic and atraumatic.  Eyes: EOM are normal. Pupils are equal, round, and reactive to light.  Neck: Normal range of motion. Neck supple.  Cardiovascular: Normal rate and regular rhythm.   Respiratory: Effort normal and breath sounds normal.  GI: Soft. Bowel sounds are normal.  Musculoskeletal:       Left knee: She exhibits decreased range of motion, swelling, effusion and abnormal alignment. Tenderness found. Medial joint line and lateral joint line tenderness noted.  Neurological: She is alert and oriented to person, place, and time.  Skin: Skin is warm and dry.  Psychiatric: She has a normal mood and affect.    Vital signs in last 24 hours: Temp:  [98.4 F (36.9 C)] 98.4 F (36.9 C) (12/15 0710) Pulse Rate:  [110] 110 (12/15 0710) Resp:  [18] 18 (12/15 0710) BP: (178)/(88) 178/88  (12/15 0710) SpO2:  [99 %] 99 % (12/15 0710) Weight:  [242 lb (109.8 kg)] 242 lb (109.8 kg) (12/15 0708)  Labs:   Estimated body mass index is 39.06 kg/m as calculated from the following:   Height as of this encounter: 5\' 6"  (1.676 m).   Weight as of this encounter: 242 lb (109.8 kg).   Imaging Review Plain radiographs demonstrate severe degenerative joint disease of the left knee(s). The overall alignment ismild varus. The bone quality appears to be excellent for age and reported activity level.  Assessment/Plan:  End stage arthritis, left knee   The patient history, physical examination, clinical judgment of the provider and imaging studies are consistent with end stage degenerative joint disease of the left knee(s) and total knee arthroplasty is deemed medically necessary. The treatment options including medical management, injection therapy arthroscopy and arthroplasty were discussed at length. The risks and benefits of total knee arthroplasty were presented and reviewed. The risks due to aseptic loosening, infection, stiffness, patella tracking problems, thromboembolic complications and other imponderables were discussed. The patient acknowledged the explanation, agreed to proceed with the plan and consent was signed. Patient is being admitted for inpatient treatment for surgery, pain control, PT, OT, prophylactic antibiotics, VTE prophylaxis, progressive ambulation and ADL's and discharge planning. The patient is planning to be discharged home with home health services

## 2016-03-22 NOTE — Progress Notes (Signed)
Portable AP and Lateral Left Knee X-rays done. 

## 2016-03-22 NOTE — Discharge Instructions (Signed)

## 2016-03-22 NOTE — Discharge Summary (Signed)
Patient ID: Nancy [name] MRN: [id number] DOB/AGE: April 20, 1955 60 y.o.  Admit date: 03/22/2016 Discharge date: 03/22/2016  Admission Diagnoses:  Principal Problem:   Unilateral primary osteoarthritis, left knee Active Problems:   Status post total left knee replacement   Discharge Diagnoses:  Same  Past Medical History:  Diagnosis Date  . Arthritis   . Hypertension    "white coat syndrome"- no meds  . Medical history non-contributory     Surgeries: Procedure(s): LEFT TOTAL KNEE ARTHROPLASTY on 03/22/2016   Consultants:   Discharged Condition: Improved  Hospital Course: Nancy [name] is an 60 y.o. female who was admitted 03/22/2016 for operative treatment ofUnilateral primary osteoarthritis, left knee. Patient has severe unremitting pain that affects sleep, daily activities, and work/hobbies. After pre-op clearance the patient was taken to the operating room on 03/22/2016 and underwent  Procedure(s): LEFT TOTAL KNEE ARTHROPLASTY.    Patient was given perioperative antibiotics: Anti-infectives    Start     Dose/Rate Route Frequency Ordered Stop   03/22/16 1600  ceFAZolin (ANCEF) IVPB 1 g/50 mL premix     1 g 100 mL/hr over 30 Minutes Intravenous Every 6 hours 03/22/16 1254 03/23/16 0359   03/22/16 0653  ceFAZolin (ANCEF) IVPB 2g/100 mL premix     2 g 200 mL/hr over 30 Minutes Intravenous On call to O.R. 03/22/16 [id number] 03/22/16 0853       Patient was given sequential compression devices, early ambulation, and chemoprophylaxis to prevent DVT.  Patient benefited maximally from hospital stay and there were no complications.    Recent vital signs: Patient Vitals for the past 24 hrs:  BP Temp Temp src Pulse Resp SpO2 Height Weight  03/22/16 1426 140/80 98.3 F (36.8 C) Oral (!) 106 16 94 % - -  03/22/16 1343 140/68 98 F (36.7 C) Oral (!) 102 16 93 % - -  03/22/16 1235 (!) 146/76 97.7 F (36.5 C) - 92 16 99 % - -  03/22/16 1215 (!) 161/82 97.4 F  (36.3 C) - 88 17 99 % - -  03/22/16 1200 (!) 165/89 - - 91 14 100 % - -  03/22/16 1145 (!) 160/90 - - 88 13 100 % - -  03/22/16 1130 (!) 153/90 - - 93 17 97 % - -  03/22/16 1115 (!) 159/85 - - 99 14 97 % - -  03/22/16 1113 (!) 163/91 98.4 F (36.9 C) - 95 16 97 % - -  03/22/16 0839 - - - (!) 103 10 98 % - -  03/22/16 0838 - - - (!) 106 15 98 % - -  03/22/16 0837 - - - (!) 109 (!) 23 99 % - -  03/22/16 0836 (!) 171/94 - - (!) 107 - 98 % - -  03/22/16 0835 - - - - - 97 % - -  03/22/16 0710 (!) 178/88 98.4 F (36.9 C) Oral (!) 110 18 99 % - -  03/22/16 0708 - - - - - - 5\' 6"  (1.676 m) 242 lb (109.8 kg)     Recent laboratory studies:  Recent Labs  03/21/16 1054  WBC 7.9  HGB 14.7  HCT 42.7  PLT 268     Discharge Medications:   Allergies as of 03/22/2016   No Known Allergies     Medication List    TAKE these medications   acetaminophen 500 MG tablet Commonly known as:  TYLENOL Take 1,000 mg by mouth 2 (two) times daily.   acetaminophen 500 MG tablet Commonly  known as:  TYLENOL Take 1,000 mg by mouth every 6 (six) hours as needed for mild pain.   rivaroxaban 10 MG Tabs tablet Commonly known as:  XARELTO Take 1 tablet (10 mg total) by mouth daily with breakfast.            Durable Medical Equipment        Start     Ordered   03/22/16 1255  DME Walker rolling  Once    Question:  Patient needs a walker to treat with the following condition  Answer:  Status post total left knee replacement   03/22/16 1254      Diagnostic Studies: Dg Knee Left Port  Result Date: 03/22/2016 CLINICAL DATA:  Total knee arthroplasty EXAM: PORTABLE LEFT KNEE - 1-2 VIEW COMPARISON:  None. FINDINGS: Total knee replacement satisfactory position and alignment. Joint effusion with gas. No fracture or loosening. IMPRESSION: Satisfactory knee replacement. Electronically Signed   By: Marlan Palauharles  Clark M.D.   On: 03/22/2016 11:53    Disposition: 01-Home or Self Care  Discharge  Instructions    Call MD / Call 911    Complete by:  As directed    If you experience chest pain or shortness of breath, CALL 911 and be transported to the hospital emergency room.  If you develope a fever above 101 F, pus (white drainage) or increased drainage or redness at the wound, or calf pain, call your surgeon's office.   Constipation Prevention    Complete by:  As directed    Drink plenty of fluids.  Prune juice may be helpful.  You may use a stool softener, such as Colace (over the counter) 100 mg twice a day.  Use MiraLax (over the counter) for constipation as needed.   Diet - low sodium heart healthy    Complete by:  As directed    Discharge patient    Complete by:  As directed    Increase activity slowly as tolerated    Complete by:  As directed       Follow-up Information    Kathryne Hitchhristopher Y Dayn Barich, MD Follow up in 2 week(s).   Specialty:  Orthopedic Surgery Contact information: [address] [place] [address] [phone]            Signed: Kathryne HitchChristopher Y Berenice Oehlert 03/22/2016, 3:31 PM

## 2016-03-23 NOTE — Op Note (Signed)
NAMMarland Kitchen:  Nancy [name], Nancy [name]      ACCOUNT NO.:  [id number]  MEDICAL RECORD NO.:  [id number]  LOCATION:                                FACILITY:  [facility]  PHYSICIAN:  Vanita PandaChristopher Y. Magnus IvanBlackman, M.D.DATE OF BIRTH:  [birth year]  DATE OF PROCEDURE:  03/12/2016 DATE OF DISCHARGE:                              OPERATIVE REPORT   PREOPERATIVE DIAGNOSIS:  Left knee primary osteoarthritis and degenerative joint disease.  POSTOPERATIVE DIAGNOSIS:  Left knee primary osteoarthritis and degenerative joint disease.  PROCEDURE:  Left total knee arthroplasty.  IMPLANTS:  Stryker Triathlon knee with size 4 femur, size 3 tibial tray, 11-mm fixed-bearing polyethylene insert, size 29 central patellar button.  SURGEON:  Vanita PandaChristopher Y. Magnus IvanBlackman, M.D.  ASSISTANT:  Richardean CanalGilbert Clark, PA-C.  ANESTHESIA: 1. Left lower extremity adductor canal block. 2. Spinal.  ANTIBIOTICS:  2 g of IV Ancef.  BLOOD LOSS:  Less than 100 mL.  TOURNIQUET TIME:  Less than 1 hour.  COMPLICATIONS:  None.  INDICATIONS:  Ms. Nancy [name] is a very pleasant 60 year old female, well known to me.  She has a history of left total hip arthroplasty and a right total knee arthroplasty.  She has debilitating arthritis of her left knee.  It has detrimentally affecting her activities of daily living, her mobility, and quality of life.  At this point, she does wish to proceed with a left total knee arthroplasty.  She understands the risks and benefits of this, having had this done successful on the right knee.  PROCEDURE DESCRIPTION:  After informed consent was obtained, appropriate left knee was marked.  Anesthesia was obtained with adductor canal block.  She was then brought to the operating room, sat upon the operating table, and spinal anesthesia was obtained.  She was then laid in a supine position.  A Foley catheter was placed.  A nonsterile tourniquet was placed around her upper left thigh.  Her left leg was prepped and  draped from the thigh down the toes with DuraPrep and sterile drapes.  Time-out was called and she was identified as correct patient, correct left knee.  We then used an Esmarch to wrap out the leg and tourniquet was inflated to 300 mm of pressure.  I then made a midline incision over the patella and carried this proximally and distally.  We dissected down the knee joint and carried out a medial parapatellar arthrotomy.  We found a large joint effusion and significant osteophytes throughout the knee.  We removed remnants of the ACL, PCL, medial and lateral meniscus.  With the knee in a flexed position using the extramedullary cutting guide for the tibia, we referenced it to take 9 mm off the high side correcting her varus, valgus, and neutral slope.  We made this cut without difficulty.  We then went to the femur and used the intramedullary guide through the notch of the femur, setting our distal femoral cut at 8 mm for a left knee and externally rotated 5 degrees.  We made this cut without difficulty and brought the knee back down in extension and with a 9-mm extension block, she had full extension, maybe just slightly hyperextended.  We then went back to the femur and put our femoral sizing guide based off the  epicondylar axis in 30 degrees, left externally rotated.  We chose a size 4 femur.  With the 4-in-1 cutting block, we made our anterior and posterior cuts followed by our chamfer cuts.  We then made our femoral box cut.  Attention was then turned to the tibia.  We were able to trial a size 3 tibial tray, and we set the rotation off the femur and the tibial tubercle.  We made our keel cut off this.  With the trial 3 tibia in place, with the 4 femur, we trialed 11 mm fix-bearing polyethylene insert, and we were pleased with the range of motion and stability.  We then made our patellar cut and drilled 3 holes for a central patellar button measuring 29.  With all trial components in  place, we were pleased with stability and range of motion.  We then removed all trial components and irrigated the knee with normal saline solution using pulsatile lavage.  We mixed our cement and then cemented the real Stryker Triathlon tibial tray size 3, followed by the real size 4 femur.  We placed the real 11 mm fix-bearing polyethylene insert after removing cement debris from the knee and cemented our patellar button.  Once the cement had hardened, we let the tourniquet down.  Hemostasis was obtained with electrocautery.  We then irrigated the knee one more time with normal saline solution using pulsatile lavage.  We closed the arthrotomy with interrupted #1 Vicryl suture followed by 0 Vicryl in the deep tissue, 2-0 Vicryl in the subcutaneous tissue, 4-0 Monocryl in subcuticular stitch, and Steri- Strips on the skin.  Well-padded sterile dressing was applied.  She was taken to the recovery room in stable condition.  All final counts were correct.  There were no complications noted.  Of note, Richardean CanalGilbert Clark, PA- C assisted in the entire case.  His assistance was crucial for facilitating all aspects of this case.     Vanita Pandahristopher Y. Magnus IvanBlackman, M.D.     CYB/MEDQ  D:  03/22/2016  T:  03/23/2016  Job:  [id number]

## 2016-03-25 NOTE — Addendum Note (Signed)
Addendum  created 03/25/16 1404 by Elyn PeersSandra J Wayde Gopaul, CRNA   Charge Capture section accepted

## 2016-03-25 NOTE — Addendum Note (Signed)
Addendum  created 03/25/16 1402 by Elyn PeersSandra J Kaytlen Lightsey, CRNA   Charge Capture section accepted

## 2016-04-04 ENCOUNTER — Inpatient Hospital Stay (INDEPENDENT_AMBULATORY_CARE_PROVIDER_SITE_OTHER): Payer: Self-pay | Admitting: Orthopaedic Surgery

## 2016-04-04 ENCOUNTER — Ambulatory Visit (INDEPENDENT_AMBULATORY_CARE_PROVIDER_SITE_OTHER): Payer: BLUE CROSS/BLUE SHIELD | Admitting: Physician Assistant

## 2016-04-04 DIAGNOSIS — Z96652 Presence of left artificial knee joint: Secondary | ICD-10-CM

## 2016-04-04 NOTE — Progress Notes (Signed)
   Office Visit Note   Patient: Nancy ColeSheila Bryson-Eckroade           Date of Birth: 01/16/1956           MRN: [id number] Visit Date: 04/04/2016              Requested by: No referring provider defined for this encounter. PCP: No PCP Per Patient   Assessment & Plan: Visit Diagnoses: No diagnosis found.  Plan: She will call us if she has any concerns or questions. She will be returning to [place] in the near future but knows that she Can always contact us with any questions. She'll go on aspirin 325 once daily for another week and then discontinue  Follow-Up Instructions: No Follow-up on file.   Orders:  No orders of the defined types were placed in this encounter.  No orders of the defined types were placed in this encounter.     Procedures: No procedures performed   Clinical Data: No additional findings.   Subjective: No chief complaint on file.   HPI 2 weeks status post left total knee arthroplasty. She's overall doing very well happy with the results. She has no complaints she's had no fevers or chills Review of Systems   Objective: Vital Signs: There were no vitals taken for this visit.  Physical Exam Well-developed well-nourished female in no acute distress. Mood and Affect appropriate. Psychiatric alert and oriented 3 Ortho Exam Left surgical incisions healing well no signs of infection calf supple nontender. She has full extension and flexion to around 110. Specialty Comments:  No specialty comments available.  Imaging: No results found.   PMFS History: Patient Active Problem List   Diagnosis Date Noted  . Unilateral primary osteoarthritis, left knee 03/22/2016  . Status post total left knee replacement 03/22/2016  . Osteoarthritis of right knee 09/01/2015  . Status post total right knee replacement 09/01/2015  . Osteoarthritis of left hip 06/23/2015  . Status post total replacement of left hip 06/23/2015   Past Medical History:  Diagnosis Date    . Arthritis   . Hypertension    "white coat syndrome"- no meds  . Medical history non-contributory     No family history on file.  Past Surgical History:  Procedure Laterality Date  . TOTAL HIP ARTHROPLASTY Left 06/23/2015   Procedure: LEFT TOTAL HIP ARTHROPLASTY ANTERIOR APPROACH;  Surgeon: Kathryne Hitchhristopher Y Blackman, MD;  Location: [facility];  Service: Orthopedics;  Laterality: Left;  . TOTAL KNEE ARTHROPLASTY Right 09/01/2015   Procedure: RIGHT TOTAL KNEE ARTHROPLASTY;  Surgeon: Kathryne Hitchhristopher Y Blackman, MD;  Location: [facility];  Service: Orthopedics;  Laterality: Right;  . TOTAL KNEE ARTHROPLASTY Left 03/22/2016   Procedure: LEFT TOTAL KNEE ARTHROPLASTY;  Surgeon: Kathryne Hitchhristopher Y Blackman, MD;  Location: [facility];  Service: Orthopedics;  Laterality: Left;   Social History   Occupational History  . Not on file.   Social History Main Topics  . Smoking status: Never Smoker  . Smokeless tobacco: Never Used  . Alcohol use Yes     Comment: very rare  . Drug use: No  . Sexual activity: Not on file

## 2017-07-01 IMAGING — DX DG KNEE 1-2V PORT*L*
2 series · 2 of 2 positions shown · non-contrast
Comparison: None.

CLINICAL DATA: Total knee arthroplasty

EXAM:
PORTABLE LEFT KNEE - 1-2 VIEW

[knee lat (1 of 2)]
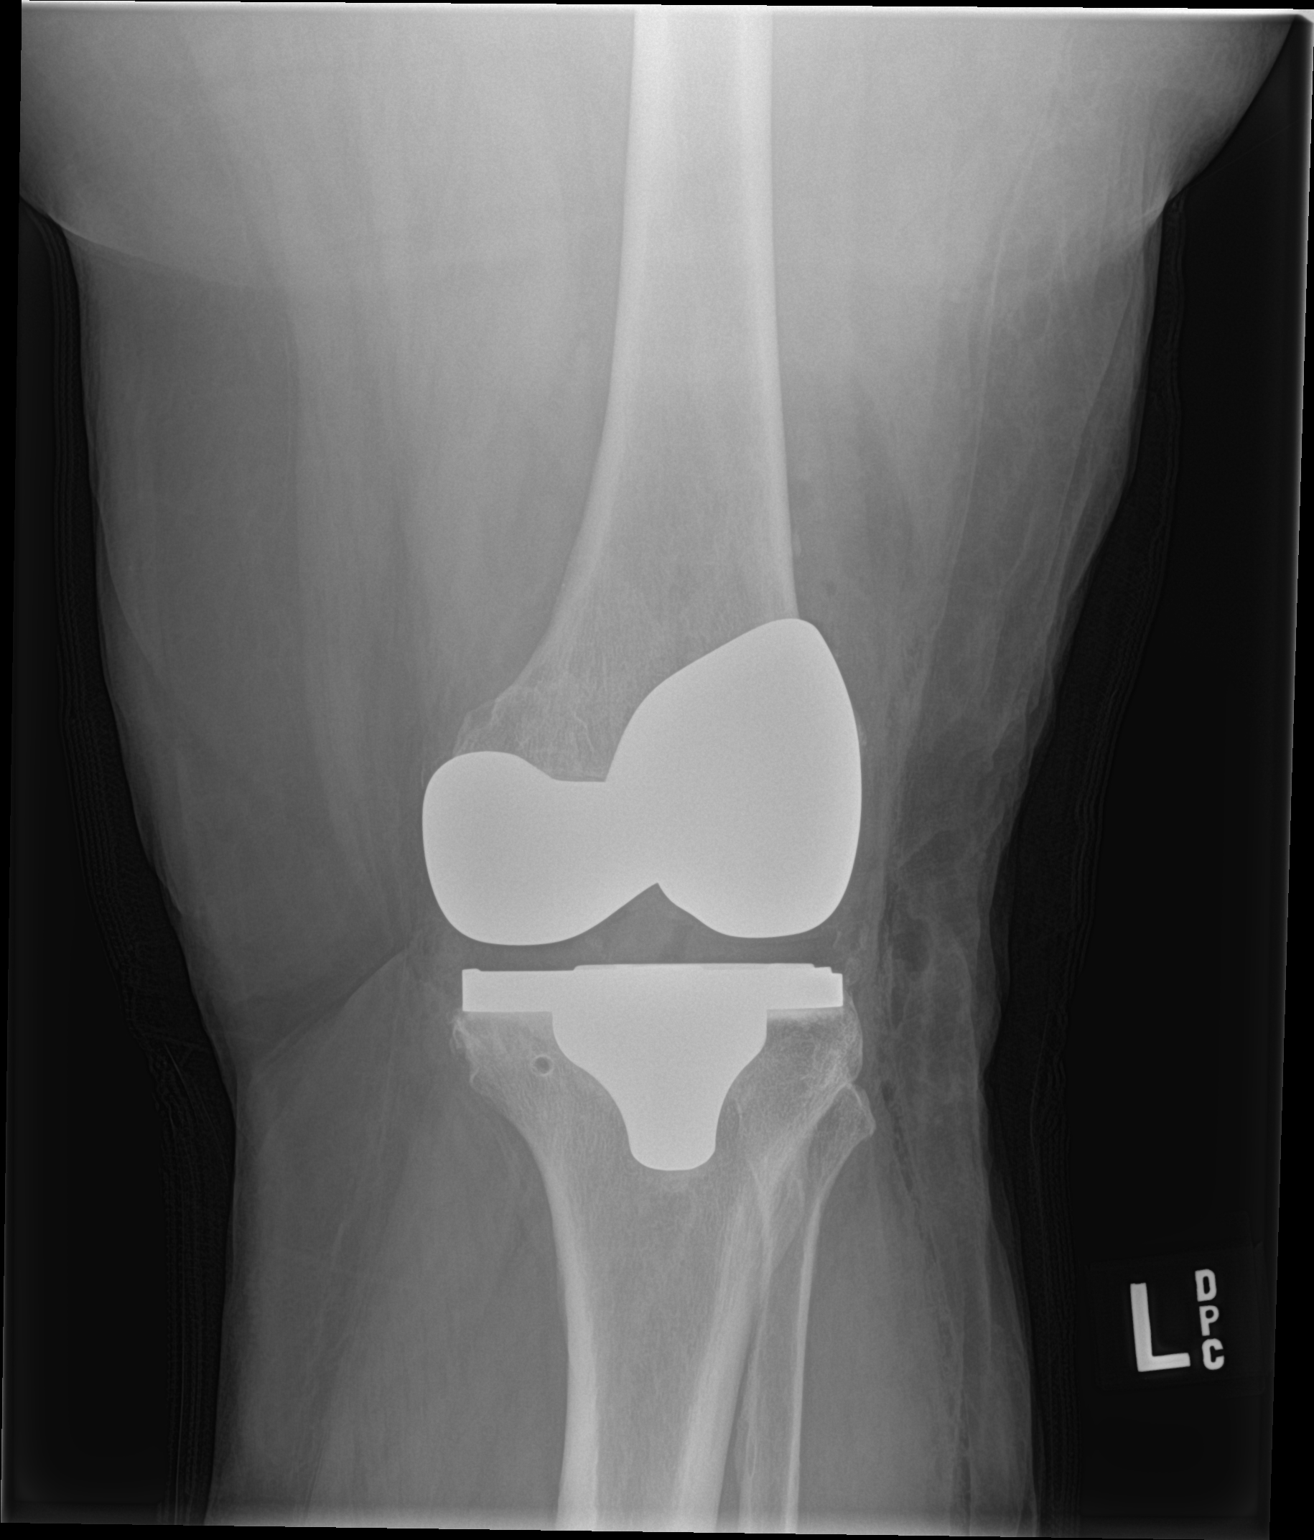

[knee lat (2 of 2)]
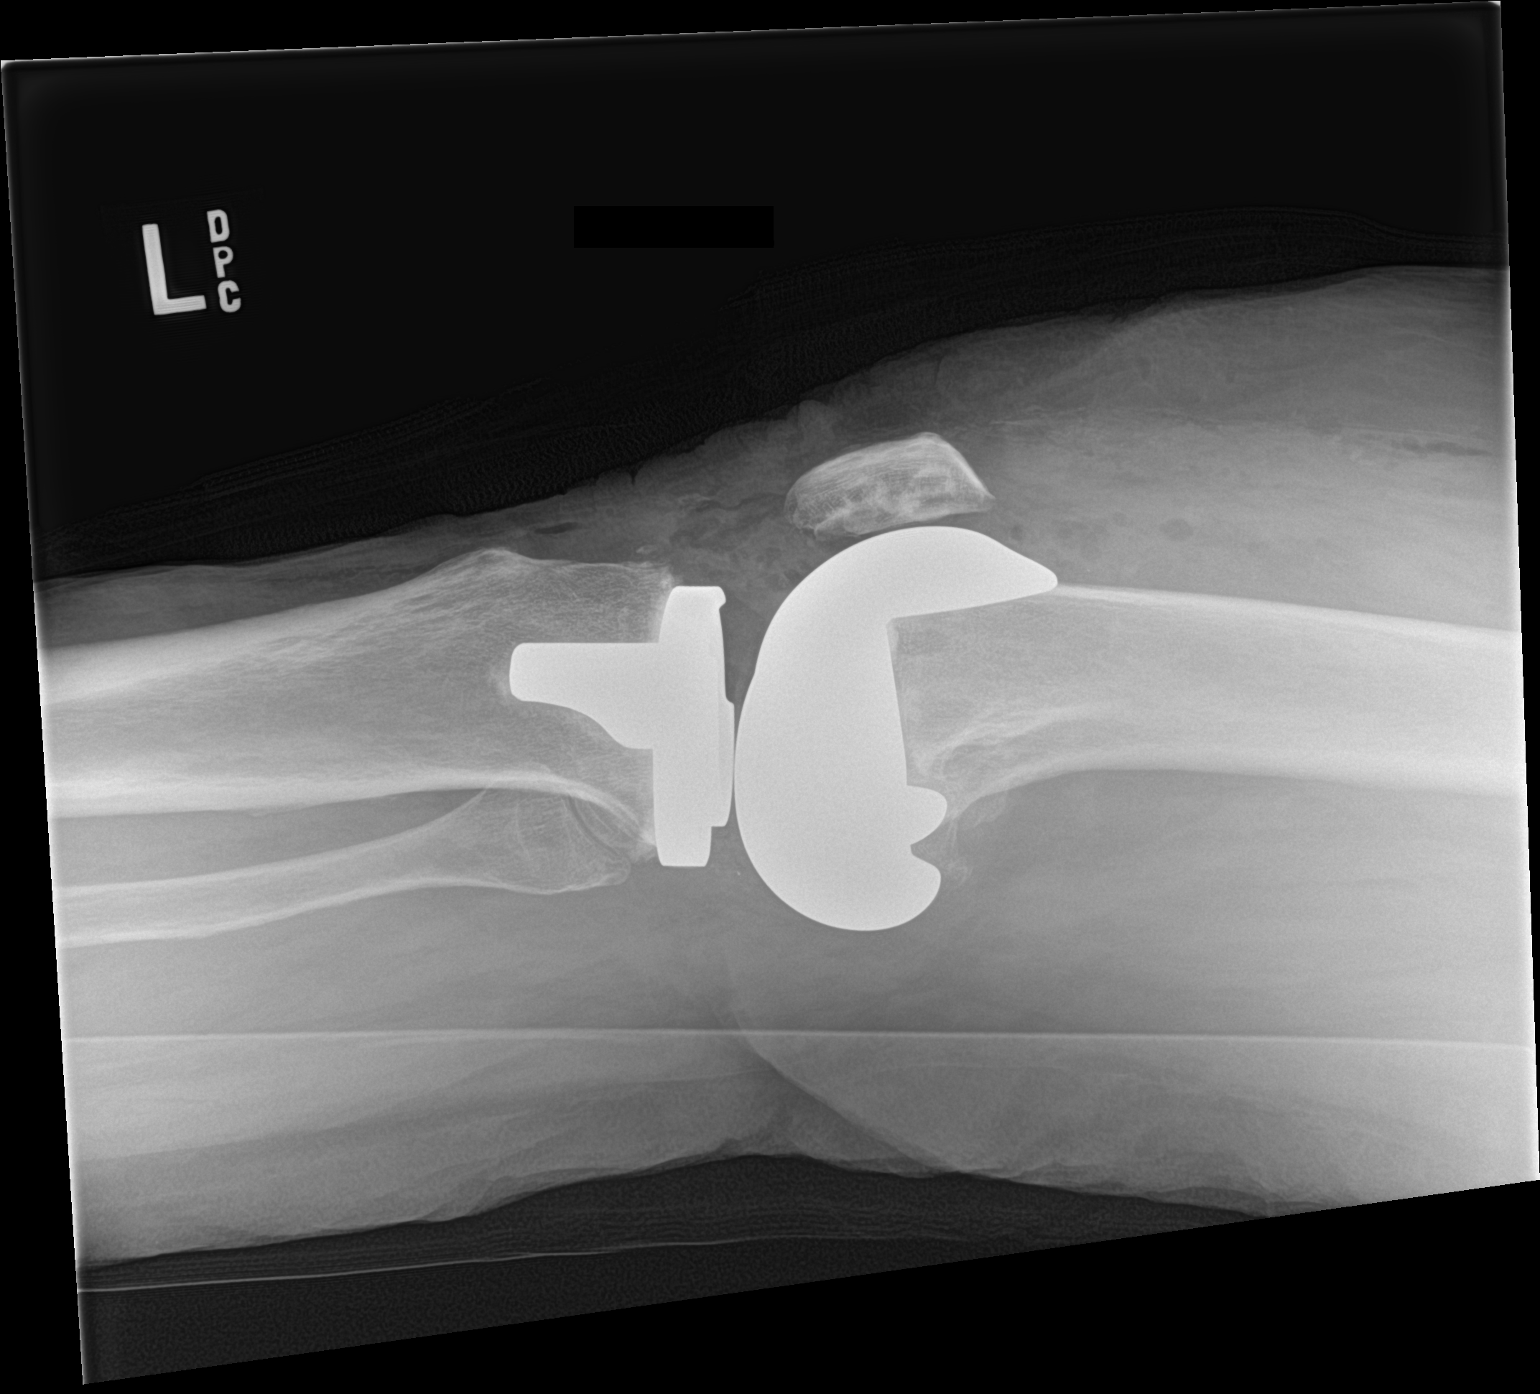

[2 of 2 positions shown; findings below may reference images not displayed]

FINDINGS: Total knee replacement satisfactory position and alignment. Joint
effusion with gas. No fracture or loosening.
IMPRESSION: Satisfactory knee replacement.

## 2019-09-27 ENCOUNTER — Other Ambulatory Visit: Payer: Self-pay

## 2019-09-27 ENCOUNTER — Ambulatory Visit (INDEPENDENT_AMBULATORY_CARE_PROVIDER_SITE_OTHER): Payer: BC Managed Care – PPO | Admitting: Orthopaedic Surgery

## 2019-09-27 ENCOUNTER — Ambulatory Visit: Payer: Self-pay

## 2019-09-27 DIAGNOSIS — M1611 Unilateral primary osteoarthritis, right hip: Secondary | ICD-10-CM

## 2019-09-27 DIAGNOSIS — M25551 Pain in right hip: Secondary | ICD-10-CM

## 2019-09-27 NOTE — Progress Notes (Signed)
Office Visit Note   Patient: Nancy [name]           Date of Birth: December 16, 1955           MRN: [id number] Visit Date: 09/27/2019              Requested by: No referring provider defined for this encounter. PCP: Patient, No Pcp Per   Assessment & Plan: Visit Diagnoses:  1. Pain in right hip   2. Unilateral primary osteoarthritis, right hip     Plan: At this point she is interested in proceeding with right total hip arthroplasty surgery in August of this year and I agree with this based on what we are seeing on plain films and clinical exam.  Having had this done before she is fully aware of the risk and benefits of surgery.  We talked about her interoperative and postoperative course and what to expect.  We will work on getting this scheduled in August of this year.  All questions and concerns were answered and and addressed  Follow-Up Instructions: Return for 2 weeks post-op.   Orders:  Orders Placed This Encounter  Procedures  . XR HIP UNILAT W OR W/O PELVIS 1V RIGHT   No orders of the defined types were placed in this encounter.     Procedures: No procedures performed   Clinical Data: No additional findings.   Subjective: Chief Complaint  Patient presents with  . Right Hip - Pain  The patient is well-known to me.  She has a left total hip arthroplasty and bilateral knee replacements.  She is from [place] but comes down here for her surgeries because of family in the area.  We replaced her knees and her hip in 2017.  This was left hip.  She is now developed debilitating right hip pain.  Her x-rays from 2017 did not show any significant disease in her left hip but x-rays today show severe end-stage arthritis at this point of the right hip.  Her pain is daily and is 10 out of 10.  She ambulates using a cane.  She is work on activity modification and taking anti-inflammatories.  She is work on weight loss.  She does weigh 301 pounds.  Her left total hip and her  bilateral total knees have done well.  At this point her right hip is definitely affecting her actives daily living, her quality of life and her mobility to the point that she does wish to proceed with a right total hip arthroplasty.  She has had no other acute change in her medical status.  HPI  Review of Systems She currently denies any headache, chest pain, shortness of breath, fever, chills, nausea, vomiting  Objective: Vital Signs: There were no vitals taken for this visit.  Physical Exam She is alert and orient x3 and in no acute distress Ortho Exam Examination of her left hip and bilateral knees are normal examination of her right hip shows severe pain with internal and external rotation and stiffness with rotation of her hip. Specialty Comments:  No specialty comments available.  Imaging: XR HIP UNILAT W OR W/O PELVIS 1V RIGHT  Result Date: 09/27/2019 An AP pelvis and lateral the right hip when compared to films from 2017 shows severe end-stage arthritis.  There is complete loss of joint space.  There are sclerotic changes in the femoral head and acetabulum.  There are periarticular osteophytes around the joint.  The hip is bone-on-bone wear.    PMFS History:  Patient Active Problem List   Diagnosis Date Noted  . Unilateral primary osteoarthritis, right hip 09/27/2019  . Unilateral primary osteoarthritis, left knee 03/22/2016  . Status post total left knee replacement 03/22/2016  . Osteoarthritis of right knee 09/01/2015  . Status post total right knee replacement 09/01/2015  . Osteoarthritis of left hip 06/23/2015  . Status post total replacement of left hip 06/23/2015   Past Medical History:  Diagnosis Date  . Arthritis   . Hypertension    "white coat syndrome"- no meds  . Medical history non-contributory     No family history on file.  Past Surgical History:  Procedure Laterality Date  . TOTAL HIP ARTHROPLASTY Left 06/23/2015   Procedure: LEFT TOTAL HIP  ARTHROPLASTY ANTERIOR APPROACH;  Surgeon: Mcarthur Rossetti, MD;  Location: [facility];  Service: Orthopedics;  Laterality: Left;  . TOTAL KNEE ARTHROPLASTY Right 09/01/2015   Procedure: RIGHT TOTAL KNEE ARTHROPLASTY;  Surgeon: Mcarthur Rossetti, MD;  Location: [facility];  Service: Orthopedics;  Laterality: Right;  . TOTAL KNEE ARTHROPLASTY Left 03/22/2016   Procedure: LEFT TOTAL KNEE ARTHROPLASTY;  Surgeon: Mcarthur Rossetti, MD;  Location: [facility];  Service: Orthopedics;  Laterality: Left;   Social History   Occupational History  . Not on file  Tobacco Use  . Smoking status: Never Smoker  . Smokeless tobacco: Never Used  Substance and Sexual Activity  . Alcohol use: Yes    Comment: very rare  . Drug use: No  . Sexual activity: Not on file

## 2019-10-21 ENCOUNTER — Other Ambulatory Visit: Payer: Self-pay

## 2019-12-03 NOTE — Progress Notes (Signed)
Need orders in epic.  Surgery on 12/10/2019.  Thank you.

## 2019-12-06 ENCOUNTER — Other Ambulatory Visit: Payer: Self-pay | Admitting: Physician Assistant

## 2019-12-06 NOTE — Progress Notes (Signed)
DUE TO COVID-19 ONLY ONE VISITOR IS ALLOWED TO COME WITH YOU AND STAY IN THE WAITING ROOM ONLY DURING PRE OP AND PROCEDURE DAY OF SURGERY. THE 1 VISITOR  MAY VISIT WITH YOU AFTER SURGERY IN YOUR PRIVATE ROOM DURING VISITING HOURS ONLY!  YOU NEED TO HAVE A COVID 19 TEST ON_8/31/2021 ______ @_______ , THIS TEST MUST BE DONE BEFORE SURGERY,  COVID TESTING SITE [address] [place] , IT IS ON THE RIGHT GOING OUT [address] APPROXIMATELY  2 MINUTES PAST [employer] ON THE RIGHT. ONCE YOUR COVID TEST IS COMPLETED,  PLEASE BEGIN THE QUARANTINE INSTRUCTIONS AS OUTLINED IN YOUR HANDOUT.                Nancy [name]  12/06/2019   Your procedure is scheduled on: 12/10/2019    Report to [facility] Main  Entrance   Report to admitting at   0515 AM     Call this number if you have problems the morning of surgery [phone]    REMEMBER: NO  SOLID FOOD CANDY OR GUM AFTER MIDNIGHT. CLEAR LIQUIDS UNTIL  0415am       . NOTHING BY MOUTH EXCEPT CLEAR LIQUIDS UNTIL    . PLEASE FINISH ENSURE DRINK PER SURGEON ORDER  WHICH NEEDS TO BE COMPLETED AT 0415am     .      CLEAR LIQUID DIET   Foods Allowed                                                                    Coffee and tea, regular and decaf                            Fruit ices (not with fruit pulp)                                      Iced Popsicles                                    Carbonated beverages, regular and diet                                    Cranberry, grape and apple juices Sports drinks like Gatorade Lightly seasoned clear broth or consume(fat free) Sugar, honey syrup ___________________________________________________________________      BRUSH YOUR TEETH MORNING OF SURGERY AND RINSE YOUR MOUTH OUT, NO CHEWING GUM CANDY OR MINTS.     Take these medicines the morning of surgery with A SIP OF WATER:   DO NOT TAKE ANY DIABETIC MEDICATIONS DAY OF YOUR SURGERY                                You may not have any metal on your body including hair pins and              piercings  Do not wear jewelry, make-up, lotions, powders  or perfumes, deodorant             Do not wear nail polish on your fingernails.  Do not shave  48 hours prior to surgery.              Men may shave face and neck.   Do not bring valuables to the hospital. [place].  Contacts, dentures or bridgework may not be worn into surgery.  Leave suitcase in the car. After surgery it may be brought to your room.     Patients discharged the day of surgery will not be allowed to drive home. IF YOU ARE HAVING SURGERY AND GOING HOME THE SAME DAY, YOU MUST HAVE AN ADULT TO DRIVE YOU HOME AND BE WITH YOU FOR 24 HOURS. YOU MAY GO HOME BY TAXI OR UBER OR ORTHERWISE, BUT AN ADULT MUST ACCOMPANY YOU HOME AND STAY WITH YOU FOR 24 HOURS.  Name and phone number of your driver:  Special Instructions: N/A              Please read over the following fact sheets you were given: _____________________________________________________________________  [facility] - Preparing for Surgery Before surgery, you can play an important role.  Because skin is not sterile, your skin needs to be as free of germs as possible.  You can reduce the number of germs on your skin by washing with CHG (chlorahexidine gluconate) soap before surgery.  CHG is an antiseptic cleaner which kills germs and bonds with the skin to continue killing germs even after washing. Please DO NOT use if you have an allergy to CHG or antibacterial soaps.  If your skin becomes reddened/irritated stop using the CHG and inform your nurse when you arrive at Short Stay. Do not shave (including legs and underarms) for at least 48 hours prior to the first CHG shower.  You may shave your face/neck. Please follow these instructions carefully:  1.  Shower with CHG Soap the night before surgery and the  morning of  Surgery.  2.  If you choose to wash your hair, wash your hair first as usual with your  normal  shampoo.  3.  After you shampoo, rinse your hair and body thoroughly to remove the  shampoo.                           4.  Use CHG as you would any other liquid soap.  You can apply chg directly  to the skin and wash                       Gently with a scrungie or clean washcloth.  5.  Apply the CHG Soap to your body ONLY FROM THE NECK DOWN.   Do not use on face/ open                           Wound or open sores. Avoid contact with eyes, ears mouth and genitals (private parts).                       Wash face,  Genitals (private parts) with your normal soap.             6.  Wash thoroughly, paying special attention to the area  where your surgery  will be performed.  7.  Thoroughly rinse your body with warm water from the neck down.  8.  DO NOT shower/wash with your normal soap after using and rinsing off  the CHG Soap.                9.  Pat yourself dry with a clean towel.            10.  Wear clean pajamas.            11.  Place clean sheets on your bed the night of your first shower and do not  sleep with pets. Day of Surgery : Do not apply any lotions/deodorants the morning of surgery.  Please wear clean clothes to the hospital/surgery center.  FAILURE TO FOLLOW THESE INSTRUCTIONS MAY RESULT IN THE CANCELLATION OF YOUR SURGERY PATIENT SIGNATURE_________________________________  NURSE SIGNATURE__________________________________  ________________________________________________________________________

## 2019-12-07 ENCOUNTER — Telehealth: Payer: Self-pay | Admitting: *Deleted

## 2019-12-07 ENCOUNTER — Other Ambulatory Visit (HOSPITAL_COMMUNITY)
Admission: RE | Admit: 2019-12-07 | Discharge: 2019-12-07 | Disposition: A | Discharge status: Home / Self Care | Payer: Federal, State, Local not specified - PPO | Source: Ambulatory Visit | Attending: Orthopaedic Surgery | Admitting: Orthopaedic Surgery

## 2019-12-07 ENCOUNTER — Other Ambulatory Visit: Payer: Self-pay

## 2019-12-07 ENCOUNTER — Encounter (HOSPITAL_COMMUNITY)
Admission: RE | Admit: 2019-12-07 | Discharge: 2019-12-07 | Disposition: A | Discharge status: Home / Self Care | Payer: Federal, State, Local not specified - PPO | Source: Ambulatory Visit | Attending: Orthopaedic Surgery | Admitting: Orthopaedic Surgery

## 2019-12-07 ENCOUNTER — Encounter (HOSPITAL_COMMUNITY): Payer: Self-pay

## 2019-12-07 DIAGNOSIS — Z01818 Encounter for other preprocedural examination: Secondary | ICD-10-CM | POA: Insufficient documentation

## 2019-12-07 DIAGNOSIS — Z20822 Contact with and (suspected) exposure to covid-19: Secondary | ICD-10-CM | POA: Diagnosis not present

## 2019-12-07 LAB — BASIC METABOLIC PANEL
Anion gap: 11 (ref 5–15)
BUN: 15 mg/dL (ref 8–23)
CO2: 24 mmol/L (ref 22–32)
Calcium: 9.2 mg/dL (ref 8.9–10.3)
Chloride: 104 mmol/L (ref 98–111)
Creatinine, Ser: 0.85 mg/dL (ref 0.44–1.00)
GFR calc Af Amer: >60 mL/min (ref >60)
GFR calc non Af Amer: >60 mL/min (ref >60)
Glucose, Bld: 112 mg/dL — ABNORMAL HIGH (ref 70–99)
Potassium: 4.1 mmol/L (ref 3.5–5.1)
Sodium: 139 mmol/L (ref 135–145)

## 2019-12-07 LAB — SURGICAL PCR SCREEN
MRSA, PCR: NEGATIVE
Staphylococcus aureus: NEGATIVE

## 2019-12-07 LAB — CBC
HCT: 43.9 % (ref 36.0–46.0)
Hemoglobin: 14.2 g/dL (ref 12.0–15.0)
MCH: 29.7 pg (ref 26.0–34.0)
MCHC: 32.3 g/dL (ref 30.0–36.0)
MCV: 91.8 fL (ref 80.0–100.0)
Platelets: 253 10*3/uL (ref 150–400)
RBC: 4.78 MIL/uL (ref 3.87–5.11)
RDW: 13.7 % (ref 11.5–15.5)
WBC: 8.9 10*3/uL (ref 4.0–10.5)
nRBC: 0 % (ref 0.0–0.2)

## 2019-12-07 LAB — SARS CORONAVIRUS 2 (TAT 6-24 HRS): SARS Coronavirus 2: NEGATIVE

## 2019-12-07 NOTE — Telephone Encounter (Signed)
RNCM call to patient to discuss her upcoming Right total hip arthroplasty with Dr. Magnus Ivan on 12/10/19. She will be an ambulatory surgery and states she did go home same day after 2 previous other total joint replacements. 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 Reviewed these with her. She will have family to assist after discharge. She has crutches and declines a FWW. She already has elevated toilet seats in her home. No DME needed. She states she will not need HHPT as she is familiar with recovery. Will continue to follow for needs. If anything should change or questions, please call Ralph Dowdy, RN Case Manager. [phone].

## 2019-12-09 MED ORDER — DEXTROSE 5 % IV SOLN
3.0000 g | INTRAVENOUS | Status: AC
  Administered 2019-12-10: 3 g via INTRAVENOUS
  Filled 2019-12-09: qty 3

## 2019-12-10 ENCOUNTER — Ambulatory Visit (HOSPITAL_COMMUNITY): Payer: Federal, State, Local not specified - PPO

## 2019-12-10 ENCOUNTER — Encounter (HOSPITAL_COMMUNITY): Payer: Self-pay | Admitting: Orthopaedic Surgery

## 2019-12-10 ENCOUNTER — Encounter (HOSPITAL_COMMUNITY)
Admission: RE | Disposition: A | Discharge status: Home / Self Care | Payer: Self-pay | Source: Other Acute Inpatient Hospital | Attending: Orthopaedic Surgery

## 2019-12-10 ENCOUNTER — Ambulatory Visit (HOSPITAL_COMMUNITY): Payer: Federal, State, Local not specified - PPO | Admitting: Certified Registered Nurse Anesthetist

## 2019-12-10 ENCOUNTER — Ambulatory Visit (HOSPITAL_COMMUNITY)
Admission: RE | Admit: 2019-12-10 | Discharge: 2019-12-10 | Disposition: A | Discharge status: Home / Self Care | Payer: Federal, State, Local not specified - PPO | Source: Other Acute Inpatient Hospital | Attending: Orthopaedic Surgery | Admitting: Orthopaedic Surgery

## 2019-12-10 DIAGNOSIS — Z96642 Presence of left artificial hip joint: Secondary | ICD-10-CM | POA: Insufficient documentation

## 2019-12-10 DIAGNOSIS — Z6841 Body Mass Index (BMI) 40.0 and over, adult: Secondary | ICD-10-CM | POA: Diagnosis not present

## 2019-12-10 DIAGNOSIS — M25751 Osteophyte, right hip: Secondary | ICD-10-CM | POA: Insufficient documentation

## 2019-12-10 DIAGNOSIS — M1611 Unilateral primary osteoarthritis, right hip: Secondary | ICD-10-CM | POA: Diagnosis not present

## 2019-12-10 DIAGNOSIS — Z96653 Presence of artificial knee joint, bilateral: Secondary | ICD-10-CM | POA: Diagnosis not present

## 2019-12-10 DIAGNOSIS — Z419 Encounter for procedure for purposes other than remedying health state, unspecified: Secondary | ICD-10-CM

## 2019-12-10 DIAGNOSIS — Z96641 Presence of right artificial hip joint: Secondary | ICD-10-CM

## 2019-12-10 HISTORY — PX: TOTAL HIP ARTHROPLASTY: SHX124

## 2019-12-10 LAB — TYPE AND SCREEN
ABO/RH(D): O POS
Antibody Screen: NEGATIVE

## 2019-12-10 SURGERY — ARTHROPLASTY, HIP, TOTAL, ANTERIOR APPROACH
Anesthesia: Spinal | Site: Hip | Laterality: Right

## 2019-12-10 MED ORDER — SODIUM CHLORIDE 0.9 % IR SOLN
Status: DC | PRN
  Administered 2019-12-10: 1000 mL

## 2019-12-10 MED ORDER — ONDANSETRON HCL 4 MG PO TABS
4.0000 mg | ORAL_TABLET | Freq: Four times a day (QID) | ORAL | Status: DC | PRN
  Filled 2019-12-10: qty 1

## 2019-12-10 MED ORDER — ACETAMINOPHEN 500 MG PO TABS
ORAL_TABLET | ORAL | Status: AC
  Filled 2019-12-10: qty 2

## 2019-12-10 MED ORDER — ONDANSETRON 4 MG PO TBDP: 4.0000 mg | ORAL_TABLET | Freq: Three times a day (TID) | ORAL | 0 refills | Status: AC | PRN

## 2019-12-10 MED ORDER — ALUM & MAG HYDROXIDE-SIMETH 200-200-20 MG/5ML PO SUSP: 30.0000 mL | ORAL | Status: DC | PRN

## 2019-12-10 MED ORDER — BUPIVACAINE-EPINEPHRINE (PF) 0.25% -1:200000 IJ SOLN
INTRAMUSCULAR | Status: DC | PRN
  Administered 2019-12-10: 30 mL

## 2019-12-10 MED ORDER — METHOCARBAMOL 500 MG PO TABS: 500.0000 mg | ORAL_TABLET | Freq: Four times a day (QID) | ORAL | Status: DC | PRN

## 2019-12-10 MED ORDER — LACTATED RINGERS IV SOLN: INTRAVENOUS | Status: DC

## 2019-12-10 MED ORDER — MIDAZOLAM HCL 5 MG/5ML IJ SOLN
INTRAMUSCULAR | Status: DC | PRN
  Administered 2019-12-10: 2 mg via INTRAVENOUS

## 2019-12-10 MED ORDER — OXYCODONE HCL 5 MG PO TABS
ORAL_TABLET | ORAL | Status: AC
  Administered 2019-12-10: 5 mg
  Filled 2019-12-10: qty 1

## 2019-12-10 MED ORDER — FENTANYL CITRATE (PF) 100 MCG/2ML IJ SOLN
INTRAMUSCULAR | Status: AC
  Filled 2019-12-10: qty 2

## 2019-12-10 MED ORDER — OXYCODONE HCL 5 MG PO TABS: 5.0000 mg | ORAL_TABLET | ORAL | Status: DC | PRN

## 2019-12-10 MED ORDER — METOCLOPRAMIDE HCL 5 MG PO TABS
5.0000 mg | ORAL_TABLET | Freq: Three times a day (TID) | ORAL | Status: DC | PRN
  Filled 2019-12-10: qty 2

## 2019-12-10 MED ORDER — METHOCARBAMOL 500 MG PO TABS: 500.0000 mg | ORAL_TABLET | Freq: Four times a day (QID) | ORAL | 1 refills | Status: AC | PRN

## 2019-12-10 MED ORDER — OXYCODONE HCL 5 MG PO TABS: 10.0000 mg | ORAL_TABLET | ORAL | Status: DC | PRN

## 2019-12-10 MED ORDER — PROPOFOL 500 MG/50ML IV EMUL
INTRAVENOUS | Status: AC
  Filled 2019-12-10: qty 50

## 2019-12-10 MED ORDER — PHENYLEPHRINE HCL-NACL 10-0.9 MG/250ML-% IV SOLN
INTRAVENOUS | Status: AC
  Filled 2019-12-10: qty 250

## 2019-12-10 MED ORDER — MENTHOL 3 MG MT LOZG
1.0000 | LOZENGE | OROMUCOSAL | Status: DC | PRN
  Filled 2019-12-10: qty 9

## 2019-12-10 MED ORDER — ACETAMINOPHEN 500 MG PO TABS
1000.0000 mg | ORAL_TABLET | Freq: Once | ORAL | Status: AC
  Administered 2019-12-10: 1000 mg via ORAL

## 2019-12-10 MED ORDER — MEPIVACAINE HCL (PF) 2 % IJ SOLN
INTRAMUSCULAR | Status: AC
  Filled 2019-12-10: qty 20

## 2019-12-10 MED ORDER — DIPHENHYDRAMINE HCL 12.5 MG/5ML PO ELIX: 12.5000 mg | ORAL_SOLUTION | ORAL | Status: DC | PRN

## 2019-12-10 MED ORDER — MIDAZOLAM HCL 2 MG/2ML IJ SOLN
INTRAMUSCULAR | Status: AC
  Filled 2019-12-10: qty 2

## 2019-12-10 MED ORDER — PHENOL 1.4 % MT LIQD
1.0000 | OROMUCOSAL | Status: DC | PRN
  Filled 2019-12-10: qty 177

## 2019-12-10 MED ORDER — 0.9 % SODIUM CHLORIDE (POUR BTL) OPTIME
TOPICAL | Status: DC | PRN
  Administered 2019-12-10: 1000 mL

## 2019-12-10 MED ORDER — HYDROMORPHONE HCL 1 MG/ML IJ SOLN: 0.5000 mg | INTRAMUSCULAR | Status: DC | PRN

## 2019-12-10 MED ORDER — OXYCODONE HCL 5 MG PO TABS
5.0000 mg | ORAL_TABLET | ORAL | 0 refills | Status: AC | PRN
Start: 2019-12-10 — End: 2020-12-09

## 2019-12-10 MED ORDER — PHENYLEPHRINE 40 MCG/ML (10ML) SYRINGE FOR IV PUSH (FOR BLOOD PRESSURE SUPPORT)
PREFILLED_SYRINGE | INTRAVENOUS | Status: AC
  Filled 2019-12-10: qty 10

## 2019-12-10 MED ORDER — SODIUM CHLORIDE 0.9 % IV SOLN: INTRAVENOUS | Status: DC

## 2019-12-10 MED ORDER — LACTATED RINGERS IV BOLUS
250.0000 mL | Freq: Once | INTRAVENOUS | Status: AC
  Administered 2019-12-10: 250 mL via INTRAVENOUS

## 2019-12-10 MED ORDER — PROPOFOL 500 MG/50ML IV EMUL
INTRAVENOUS | Status: DC | PRN
  Administered 2019-12-10: 25 ug/kg/min via INTRAVENOUS

## 2019-12-10 MED ORDER — CEFAZOLIN SODIUM-DEXTROSE 2-4 GM/100ML-% IV SOLN: 2.0000 g | Freq: Four times a day (QID) | INTRAVENOUS | Status: DC

## 2019-12-10 MED ORDER — PANTOPRAZOLE SODIUM 40 MG PO TBEC: 40.0000 mg | DELAYED_RELEASE_TABLET | Freq: Every day | ORAL | Status: DC

## 2019-12-10 MED ORDER — PROPOFOL 10 MG/ML IV BOLUS
INTRAVENOUS | Status: DC | PRN
  Administered 2019-12-10: 40 mg via INTRAVENOUS
  Administered 2019-12-10 (×2): 30 mg via INTRAVENOUS

## 2019-12-10 MED ORDER — BUPIVACAINE-EPINEPHRINE (PF) 0.25% -1:200000 IJ SOLN
INTRAMUSCULAR | Status: AC
  Filled 2019-12-10: qty 30

## 2019-12-10 MED ORDER — METHOCARBAMOL 500 MG IVPB - SIMPLE MED: 500.0000 mg | Freq: Four times a day (QID) | INTRAVENOUS | Status: DC | PRN

## 2019-12-10 MED ORDER — DOCUSATE SODIUM 100 MG PO CAPS
100.0000 mg | ORAL_CAPSULE | Freq: Two times a day (BID) | ORAL | Status: DC
  Filled 2019-12-10: qty 1

## 2019-12-10 MED ORDER — LACTATED RINGERS IV BOLUS
500.0000 mL | Freq: Once | INTRAVENOUS | Status: AC
  Administered 2019-12-10: 500 mL via INTRAVENOUS

## 2019-12-10 MED ORDER — ACETAMINOPHEN 325 MG PO TABS: 325.0000 mg | ORAL_TABLET | Freq: Four times a day (QID) | ORAL | Status: DC | PRN

## 2019-12-10 MED ORDER — ASPIRIN 81 MG PO CHEW: 81.0000 mg | CHEWABLE_TABLET | Freq: Two times a day (BID) | ORAL | Status: DC

## 2019-12-10 MED ORDER — FENTANYL CITRATE (PF) 100 MCG/2ML IJ SOLN: 25.0000 ug | INTRAMUSCULAR | Status: DC | PRN

## 2019-12-10 MED ORDER — CHLORHEXIDINE GLUCONATE 0.12 % MT SOLN
15.0000 mL | Freq: Once | OROMUCOSAL | Status: AC
  Administered 2019-12-10: 15 mL via OROMUCOSAL

## 2019-12-10 MED ORDER — STERILE WATER FOR IRRIGATION IR SOLN
Status: DC | PRN
  Administered 2019-12-10: 2000 mL

## 2019-12-10 MED ORDER — PHENYLEPHRINE HCL-NACL 10-0.9 MG/250ML-% IV SOLN
INTRAVENOUS | Status: DC | PRN
  Administered 2019-12-10: 40 ug/min via INTRAVENOUS

## 2019-12-10 MED ORDER — ONDANSETRON HCL 4 MG/2ML IJ SOLN: 4.0000 mg | Freq: Four times a day (QID) | INTRAMUSCULAR | Status: DC | PRN

## 2019-12-10 MED ORDER — PROPOFOL 1000 MG/100ML IV EMUL
INTRAVENOUS | Status: AC
  Filled 2019-12-10: qty 100

## 2019-12-10 MED ORDER — TRANEXAMIC ACID-NACL 1000-0.7 MG/100ML-% IV SOLN
1000.0000 mg | INTRAVENOUS | Status: AC
  Administered 2019-12-10: 1000 mg via INTRAVENOUS
  Filled 2019-12-10: qty 100

## 2019-12-10 MED ORDER — FENTANYL CITRATE (PF) 100 MCG/2ML IJ SOLN
INTRAMUSCULAR | Status: DC | PRN
  Administered 2019-12-10 (×2): 50 ug via INTRAVENOUS

## 2019-12-10 MED ORDER — ORAL CARE MOUTH RINSE: 15.0000 mL | Freq: Once | OROMUCOSAL | Status: AC

## 2019-12-10 MED ORDER — GABAPENTIN 100 MG PO CAPS: 100.0000 mg | ORAL_CAPSULE | Freq: Three times a day (TID) | ORAL | Status: DC

## 2019-12-10 MED ORDER — METOCLOPRAMIDE HCL 5 MG/ML IJ SOLN: 5.0000 mg | Freq: Three times a day (TID) | INTRAMUSCULAR | Status: DC | PRN

## 2019-12-10 MED ORDER — MEPIVACAINE HCL (PF) 2 % IJ SOLN
INTRAMUSCULAR | Status: DC | PRN
  Administered 2019-12-10: 60 mg via EPIDURAL

## 2019-12-10 MED ORDER — EPHEDRINE 5 MG/ML INJ
INTRAVENOUS | Status: AC
  Filled 2019-12-10: qty 10

## 2019-12-10 MED ORDER — POVIDONE-IODINE 10 % EX SWAB
2.0000 "application " | Freq: Once | CUTANEOUS | Status: AC
  Administered 2019-12-10: 2 via TOPICAL

## 2019-12-10 MED FILL — METHOCARBAMOL 500 MG TABS: 500 | 10 days supply | Qty: 40 | Fill #0

## 2019-12-10 MED FILL — ONDANSETRON ODT 4 MG TABLET: 4 | 6 days supply | Qty: 20 | Fill #0

## 2019-12-10 MED FILL — oxyCODONE HCL 5 MG TABS: 5 | 2 days supply | Qty: 30 | Fill #0

## 2019-12-10 SURGICAL SUPPLY — 39 items
APL SKNCLS STERI-STRIP NONHPOA (GAUZE/BANDAGES/DRESSINGS)
BAG SPEC THK2 15X12 ZIP CLS (MISCELLANEOUS)
BAG ZIPLOCK 12X15 (MISCELLANEOUS) IMPLANT
BENZOIN TINCTURE PRP APPL 2/3 (GAUZE/BANDAGES/DRESSINGS) IMPLANT
BLADE SAW SGTL 18X1.27X75 (BLADE) ×2 IMPLANT
COVER PERINEAL POST (MISCELLANEOUS) ×2 IMPLANT
COVER SURGICAL LIGHT HANDLE (MISCELLANEOUS) ×2 IMPLANT
COVER WAND RF STERILE (DRAPES) ×2 IMPLANT
DRAPE STERI IOBAN 125X83 (DRAPES) ×2 IMPLANT
DRAPE U-SHAPE 47X51 STRL (DRAPES) ×4 IMPLANT
DRSG AQUACEL AG ADV 3.5X10 (GAUZE/BANDAGES/DRESSINGS) ×2 IMPLANT
DURAPREP 26ML APPLICATOR (WOUND CARE) ×2 IMPLANT
ELECT REM PT RETURN 15FT ADLT (MISCELLANEOUS) ×2 IMPLANT
GAUZE XEROFORM 1X8 LF (GAUZE/BANDAGES/DRESSINGS) ×2 IMPLANT
GLOVE BIO SURGEON STRL SZ7.5 (GLOVE) ×2 IMPLANT
GLOVE BIOGEL PI IND STRL 8 (GLOVE) ×2 IMPLANT
GLOVE BIOGEL PI INDICATOR 8 (GLOVE) ×2
GLOVE ECLIPSE 8.0 STRL XLNG CF (GLOVE) ×2 IMPLANT
GOWN STRL REUS W/TWL XL LVL3 (GOWN DISPOSABLE) ×4 IMPLANT
HANDPIECE INTERPULSE COAX TIP (DISPOSABLE) ×2
HEAD CERAMIC 36 PLUS5 (Hips) ×2 IMPLANT
HOLDER FOLEY CATH W/STRAP (MISCELLANEOUS) ×2 IMPLANT
KIT TURNOVER KIT A (KITS) IMPLANT
LINER NEUTRAL 52X36MM PLUS 4 (Liner) ×2 IMPLANT
PACK ANTERIOR HIP CUSTOM (KITS) ×2 IMPLANT
PENCIL SMOKE EVACUATOR (MISCELLANEOUS) ×2 IMPLANT
PIN SECTOR W/GRIP ACE CUP 52MM (Hips) ×2 IMPLANT
SET HNDPC FAN SPRY TIP SCT (DISPOSABLE) ×1 IMPLANT
STAPLER VISISTAT 35W (STAPLE) IMPLANT
STEM CORAIL KA11 (Stem) ×2 IMPLANT
STRIP CLOSURE SKIN 1/2X4 (GAUZE/BANDAGES/DRESSINGS) IMPLANT
SUT ETHIBOND NAB CT1 #1 30IN (SUTURE) ×2 IMPLANT
SUT ETHILON 2 0 PS N (SUTURE) ×4 IMPLANT
SUT MNCRL AB 4-0 PS2 18 (SUTURE) IMPLANT
SUT VIC AB 0 CT1 36 (SUTURE) ×2 IMPLANT
SUT VIC AB 1 CT1 36 (SUTURE) ×2 IMPLANT
SUT VIC AB 2-0 CT1 27 (SUTURE) ×4
SUT VIC AB 2-0 CT1 TAPERPNT 27 (SUTURE) ×2 IMPLANT
TRAY CATH 16FR W/PLASTIC CATH (SET/KITS/TRAYS/PACK) ×2 IMPLANT

## 2019-12-10 NOTE — Anesthesia Preprocedure Evaluation (Addendum)
Anesthesia Evaluation  Patient identified by MRN, date of birth, ID band Patient awake    Reviewed: Allergy & Precautions, NPO status , Patient's Chart, lab work & pertinent test results  Airway Mallampati: II  TM Distance: >3 FB Neck ROM: Full    Dental no notable dental hx. (+) Teeth Intact, Dental Advisory Given   Pulmonary neg pulmonary ROS,    Pulmonary exam normal breath sounds clear to auscultation       Cardiovascular hypertension, negative cardio ROS Normal cardiovascular exam Rhythm:Regular Rate:Normal     Neuro/Psych negative neurological ROS  negative psych ROS   GI/Hepatic negative GI ROS, Neg liver ROS,   Endo/Other  Morbid obesity (BMI 46)  Renal/GU negative Renal ROS  negative genitourinary   Musculoskeletal  (+) Arthritis ,   Abdominal   Peds  Hematology negative hematology ROS (+)   Anesthesia Other Findings   Reproductive/Obstetrics                            Anesthesia Physical Anesthesia Plan  ASA: III  Anesthesia Plan: Spinal   Post-op Pain Management:    Induction:   PONV Risk Score and Plan: 2 and Treatment may vary due to age or medical condition, Propofol infusion and Midazolam  Airway Management Planned: Natural Airway  Additional Equipment:   Intra-op Plan:   Post-operative Plan:   Informed Consent: I have reviewed the patients History and Physical, chart, labs and discussed the procedure including the risks, benefits and alternatives for the proposed anesthesia with the patient or authorized representative who has indicated his/her understanding and acceptance.     Dental advisory given  Plan Discussed with: CRNA  Anesthesia Plan Comments:         Anesthesia Quick Evaluation

## 2019-12-10 NOTE — Care Plan (Signed)
RNCM call to patient to discuss her upcoming Right total hip arthroplasty with Dr. Blackman on 12/10/19. She will be an ambulatory surgery and states she did go home same day after 2 previous other total joint replacements. 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 Reviewed these with her. She will have family to assist after discharge. She has crutches and declines a FWW. She already has elevated toilet seats in her home. No DME needed. She states she will not need HHPT as she is familiar with recovery. Will continue to follow for needs. If anything should change or questions, please call Neeley Sedivy, RN Case Manager. [phone]. 

## 2019-12-10 NOTE — H&P (Signed)
TOTAL HIP ADMISSION H&P  Patient is admitted for right total hip arthroplasty.  Subjective:  Chief Complaint: right hip pain  HPI: Nancy [name], 64 y.o. female, has a history of pain and functional disability in the right hip(s) due to arthritis and patient has failed non-surgical conservative treatments for greater than 12 weeks to include NSAID's and/or analgesics, corticosteriod injections, flexibility and strengthening excercises, supervised PT with diminished ADL's post treatment, use of assistive devices, weight reduction as appropriate and activity modification.  Onset of symptoms was gradual starting 3 years ago with gradually worsening course since that time.The patient noted no past surgery on the right hip(s).  Patient currently rates pain in the right hip at 10 out of 10 with activity. Patient has night pain, worsening of pain with activity and weight bearing, trendelenberg gait, pain that interfers with activities of daily living and pain with passive range of motion. Patient has evidence of subchondral sclerosis, periarticular osteophytes and joint space narrowing by imaging studies. This condition presents safety issues increasing the risk of falls.  There is no current active infection.  Patient Active Problem List   Diagnosis Date Noted  . Unilateral primary osteoarthritis, right hip 09/27/2019  . Unilateral primary osteoarthritis, left knee 03/22/2016  . Status post total left knee replacement 03/22/2016  . Osteoarthritis of right knee 09/01/2015  . Status post total right knee replacement 09/01/2015  . Osteoarthritis of left hip 06/23/2015  . Status post total replacement of left hip 06/23/2015   Past Medical History:  Diagnosis Date  . Arthritis   . Hypertension    "white coat syndrome"- no meds  . Medical history non-contributory     Past Surgical History:  Procedure Laterality Date  . TOTAL HIP ARTHROPLASTY Left 06/23/2015   Procedure: LEFT TOTAL HIP  ARTHROPLASTY ANTERIOR APPROACH;  Surgeon: Kathryne Hitch, MD;  Location: [facility];  Service: Orthopedics;  Laterality: Left;  . TOTAL KNEE ARTHROPLASTY Right 09/01/2015   Procedure: RIGHT TOTAL KNEE ARTHROPLASTY;  Surgeon: Kathryne Hitch, MD;  Location: [facility];  Service: Orthopedics;  Laterality: Right;  . TOTAL KNEE ARTHROPLASTY Left 03/22/2016   Procedure: LEFT TOTAL KNEE ARTHROPLASTY;  Surgeon: Kathryne Hitch, MD;  Location: [facility];  Service: Orthopedics;  Laterality: Left;    Current Facility-Administered Medications  Medication Dose Route Frequency Provider Last Rate Last Admin  . ceFAZolin (ANCEF) 3 g in dextrose 5 % 50 mL IVPB  3 g Intravenous On Call to OR Kathryne Hitch, MD      . chlorhexidine (PERIDEX) 0.12 % solution 15 mL  15 mL Mouth/Throat Once Lewie Loron, MD       Or  . MEDLINE mouth rinse  15 mL Mouth Rinse Once Lewie Loron, MD      . lactated ringers infusion   Intravenous Continuous Lewie Loron, MD      . povidone-iodine 10 % swab 2 application  2 application Topical Once Richardean Canal W, PA-C      . tranexamic acid (CYKLOKAPRON) IVPB 1,000 mg  1,000 mg Intravenous To OR Kirtland Bouchard, PA-C       No Known Allergies  Social History   Tobacco Use  . Smoking status: Never Smoker  . Smokeless tobacco: Never Used  Substance Use Topics  . Alcohol use: Yes    Comment: very rare    No family history on file.   Review of Systems  Musculoskeletal: Positive for gait problem and joint swelling.  All other systems reviewed and  are negative.   Objective:  Physical Exam Vitals reviewed.  Constitutional:      Appearance: Normal appearance.  HENT:     Head: Normocephalic and atraumatic.  Eyes:     Extraocular Movements: Extraocular movements intact.  Cardiovascular:     Rate and Rhythm: Normal rate and regular rhythm.     Pulses: Normal pulses.  Pulmonary:     Effort: Pulmonary effort is normal.     Breath sounds: Normal  breath sounds.  Abdominal:     Palpations: Abdomen is soft.  Musculoskeletal:     Cervical back: Normal range of motion.     Right hip: Tenderness and bony tenderness present. Decreased range of motion. Decreased strength.  Neurological:     Mental Status: She is alert and oriented to person, place, and time.  Psychiatric:        Behavior: Behavior normal.     Vital signs in last 24 hours:    Labs:   Estimated body mass index is 46 kg/m as calculated from the following:   Height as of 12/07/19: 5\' 6"  (1.676 m).   Weight as of 12/07/19: 129.3 kg.   Imaging Review Plain radiographs demonstrate severe degenerative joint disease of the right hip(s). The bone quality appears to be excellent for age and reported activity level.      Assessment/Plan:  End stage arthritis, right hip(s)  The patient history, physical examination, clinical judgement of the provider and imaging studies are consistent with end stage degenerative joint disease of the right hip(s) and total hip arthroplasty is deemed medically necessary. The treatment options including medical management, injection therapy, arthroscopy and arthroplasty were discussed at length. The risks and benefits of total hip arthroplasty were presented and reviewed. The risks due to aseptic loosening, infection, stiffness, dislocation/subluxation,  thromboembolic complications and other imponderables were discussed.  The patient acknowledged the explanation, agreed to proceed with the plan and consent was signed. Patient is being admitted for inpatient treatment for surgery, pain control, PT, OT, prophylactic antibiotics, VTE prophylaxis, progressive ambulation and ADL's and discharge planning.The patient is planning to be discharged home with home health services    Patient's anticipated LOS is less than 2 midnights, meeting these requirements: - Younger than 10 - Lives within 1 hour of care - Has a competent adult at home to recover  with post-op recover - NO history of  - Chronic pain requiring opiods  - Diabetes  - Coronary Artery Disease  - Heart failure  - Heart attack  - Stroke  - DVT/VTE  - Cardiac arrhythmia  - Respiratory Failure/COPD  - Renal failure  - Anemia  - Advanced Liver disease

## 2019-12-10 NOTE — Anesthesia Postprocedure Evaluation (Signed)
Anesthesia Post Note  Patient: Elonda Giuliano  Procedure(s) Performed: RIGHT TOTAL HIP ARTHROPLASTY ANTERIOR APPROACH (Right Hip)     Patient location during evaluation: PACU Anesthesia Type: Spinal Level of consciousness: oriented and awake and alert Pain management: pain level controlled Vital Signs Assessment: post-procedure vital signs reviewed and stable Respiratory status: spontaneous breathing, respiratory function stable and patient connected to nasal cannula oxygen Cardiovascular status: blood pressure returned to baseline and stable Postop Assessment: no headache, no backache and no apparent nausea or vomiting Anesthetic complications: no   No complications documented.  Last Vitals:  Vitals:   12/10/19 1300 12/10/19 1315  BP: (!) 174/68 (!) 179/80  Pulse: 85 74  Resp: 18   Temp: 36.7 C   SpO2: 99% 99%    Last Pain:  Vitals:   12/10/19 1245  TempSrc:   PainSc: 0-No pain                 Dona Klemann L Edla Para

## 2019-12-10 NOTE — Op Note (Signed)
NAME: Nancy [name], Nancy [name] MEDICAL RECORD UJ:[id number] ACCOUNT [id number] DATE OF BIRTH:1955/10/31 FACILITY: [facility] LOCATION: [facility] PHYSICIAN:Catelynn Sparger Aretha Parrot, MD  OPERATIVE REPORT  DATE OF PROCEDURE:  12/10/2019  PREOPERATIVE DIAGNOSIS:  Primary osteoarthritis and degenerative joint disease, right hip.  POSTOPERATIVE DIAGNOSIS:  Primary osteoarthritis and degenerative joint disease, right hip.  PROCEDURE:  Right total hip arthroplasty through direct anterior approach.  IMPLANTS:  DePuy Sector Gription acetabular component size 52, size 36+4 neutral polyethylene liner, size 11 Corail femoral component with standard offset, size 36+5 ceramic hip ball.  SURGEON:  Vanita Panda. Magnus Ivan, MD  ASSISTANT:  Richardean Canal, PA-C.  ANESTHESIA:  Spinal.  ANTIBIOTICS:  3 g IV Ancef.  ESTIMATED BLOOD LOSS:  200 mL.  COMPLICATIONS:  None.  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  She actually lives in [place], but has family down here who was involved with the orthopedics at [facility].  She has debilitating arthritis involving her right hip.  I have actually  replaced her left hip and both her knees.  She does have a BMI of 46 and she understands that with her being classified as morbidly obese this makes for a more difficult case.  We had a long and thorough discussion about the risk of acute blood loss  anemia, nerve or vessel injury, fracture, infection, dislocation, DVT and implant failure.  She understands our goals are to decrease pain, improve mobility and overall improve quality of life.  DESCRIPTION OF PROCEDURE:  After informed consent was obtained and appropriate right hip was marked, she was brought to the operating room and sat up on a stretcher where spinal anesthesia was obtained.  She was laid in supine position on a stretcher.   We assessed her leg lengths and then placed traction boots on both her feet.  Next, she was placed supine  on the Hana fracture table with the perineal post in place and both legs in in-line skeletal traction device and no traction applied.  Her right  operative hip was prepped and draped with DuraPrep and sterile drapes.  A time-out was called and she was identified as correct patient, correct right hip.  We then made an incision just inferior and posterior to the anterior superior iliac spine and  carried this longitudinally down the leg.  We dissected down to the tensor fascia lata muscle through significant adipose tissue.  We were able to find the tensor fascia and then divide it longitudinally to proceed with direct anterior approach to the  hip.  We identified and cauterized circumflex vessels and identified the hip capsule, opened up the hip capsule in an L-type format, finding a very large joint effusion and significant large osteophytes around the femoral head and neck.  I placed Cobra  retractors within the joint capsule around the medial and lateral femoral neck and then made our femoral neck cut with an oscillating saw just proximal to the lesser trochanter and completed this with an osteotome.  We placed a corkscrew guide in the  femoral head and removed the femoral head in its entirety and found it to be significantly devoid of cartilage and deformed with flattening.  We then placed a bent Hohmann over the medial acetabular rim and removed remnants of the acetabular labrum and  other debris.  We then began reaming under direct visualization from a size 44 reamer in stepwise increments, going up to a size 51 with all reamers placed under direct visualization, the last reamer was also  placed under direct fluoroscopy so I could  obtain my depth of reaming, my inclination and anteversion.  I then placed the real DePuy Sector Gription acetabular component size 52 and then went with a 36+4 polyethylene liner given her offset.  She also had started shorter on this side.  Attention  was then turned to the  femur.  With the leg externally rotated to 120 degrees, extended and adducted, we were able to place a Mueller retractor medially and a Hohmann retractor above the greater trochanter, released the lateral joint capsule and used a  box-cutting osteotome to enter the femoral canal and a rongeur to lateralize.  We then began broaching using the Corail broaching system from a size 8, going up to a size 11.  The size 11 had a nice tight feel and that corresponded with her implant on  the other side.  We trialed a standard offset femoral neck and 36+15 hip ball and reduced this in the acetabulum.  We felt like we needed just a little bit more leg length and given her obesity, I think it was better to err on the side of the leg length.    We dislocated the hip and removed the trial components.  We placed the real Corail femoral component with standard offset size 11 and the real 36+5 ceramic hip ball and again reduced this in  the pelvis.  We were pleased with the leg length, offset,  range of motion and stability.  Stability was definitely most important.  I do feel like I got longer than the other side, but I needed to go with stability more so.  It was definitely tight.  We then irrigated the soft tissue with normal saline solution  using pulsatile lavage.  We dried the tissues well and then 0.25% Marcaine with epinephrine around the arthrotomy.  We closed the joint capsule with #1 Ethibond suture, followed by closing the tensor fascia with #1 Vicryl.  0 Vicryl was used to close  the deep tissue and 2-0 Vicryl was used to close subcutaneous tissue.  We did close the skin with interrupted nylon given her large thigh with obesity.  Xeroform and Aquacel dressing was applied.  She was taken off the Hana table and taken to the  recovery room in stable condition with all final counts being correct and no complications noted.  Of note, Rexene Edison, PA-C did assist during the entire case and his assistance was crucial for  facilitating all aspects of this case.  VN/NUANCE  D:12/10/2019 T:12/10/2019 JOB:[id number]

## 2019-12-10 NOTE — Anesthesia Procedure Notes (Signed)
Procedure Name: MAC Date/Time: 12/10/2019 9:30 AM Performed by: Claudia Desanctis, CRNA Pre-anesthesia Checklist: Patient identified, Suction available, Emergency Drugs available and Patient being monitored Patient Re-evaluated:Patient Re-evaluated prior to induction Oxygen Delivery Method: Simple face mask

## 2019-12-10 NOTE — Anesthesia Procedure Notes (Signed)
Spinal  Patient location during procedure: OR Start time: 12/10/2019 9:18 AM End time: 12/10/2019 9:22 AM Staffing Performed: resident/CRNA  Resident/CRNA: Vanessa Upton, CRNA Preanesthetic Checklist Completed: patient identified, IV checked, site marked, risks and benefits discussed, surgical consent, monitors and equipment checked, pre-op evaluation and timeout performed Spinal Block Patient position: sitting Prep: DuraPrep Patient monitoring: heart rate, cardiac monitor, continuous pulse ox and blood pressure Approach: midline Location: L3-4 Injection technique: single-shot Needle Needle type: Pencan  Needle gauge: 24 G Needle length: 10 cm Needle insertion depth: 9 cm Assessment Sensory level: T4

## 2019-12-10 NOTE — Brief Op Note (Signed)
12/10/2019  10:47 AM  PATIENT:  Nancy [name]  64 y.o. female  PRE-OPERATIVE DIAGNOSIS:  osteoarthritis right hip  POST-OPERATIVE DIAGNOSIS:  osteoarthritis right hip  PROCEDURE:  Procedure(s): RIGHT TOTAL HIP ARTHROPLASTY ANTERIOR APPROACH (Right)  SURGEON:  Surgeon(s) and Role:    Kathryne Hitch, MD - Primary  PHYSICIAN ASSISTANT:  Rexene Edison, PA-C  ANESTHESIA:   spinal  EBL:  200 mL   COUNTS:  YES  DICTATION: .Other Dictation: Dictation Number [id number]  PLAN OF CARE: Discharge to home after PACU  PATIENT DISPOSITION:  PACU - hemodynamically stable.   Delay start of Pharmacological VTE agent (>24hrs) due to surgical blood loss or risk of bleeding: no

## 2019-12-10 NOTE — Transfer of Care (Signed)
Immediate Anesthesia Transfer of Care Note  Patient: Nancy [name]  Procedure(s) Performed: RIGHT TOTAL HIP ARTHROPLASTY ANTERIOR APPROACH (Right Hip)  Patient Location: PACU  Anesthesia Type:Spinal  Level of Consciousness: awake, alert , oriented and patient cooperative  Airway & Oxygen Therapy: Patient Spontanous Breathing and Patient connected to face mask  Post-op Assessment: Report given to RN and Post -op Vital signs reviewed and stable  Post vital signs: Reviewed and stable  Last Vitals:  Vitals Value Taken Time  BP 130/68 12/10/19 1112  Temp    Pulse    Resp 17 12/10/19 1114  SpO2    Vitals shown include unvalidated device data.  Last Pain:  Vitals:   12/10/19 0714  TempSrc: Oral         Complications: No complications documented.

## 2019-12-10 NOTE — Discharge Instructions (Signed)

## 2019-12-10 NOTE — Evaluation (Signed)
Physical Therapy Evaluation Patient Details Name: Nancy [name] MRN: [id number] DOB: Nov 17, 1955 Today's Date: 12/10/2019   History of Present Illness  Patient is 64 y.o. female s/p Rt THA anterior approach on 12/10/19 with PMH significant for HTN, OA, Bil TKA in 2017, and Lt THA in 2017.     Clinical Impression  Nancy [name] is a 64 y.o. female POD 0 s/p Rt THA. Patient reports independence with mobility at baseline. Patient is now limited by functional impairments (see PT problem list below) and requires supervision for transfers and gait with axillary crutches. Patient was able to ambulate ~130 feet with crutches and min guard/supervision and cues for safe walker management. Patient educated on safe sequencing for stair mobility and verbalized safe guarding position for people assisting with mobility. Patient instructed in exercises to facilitate ROM and circulation. Patient will benefit from continued skilled PT interventions to address impairments and progress towards PLOF. Patient has met mobility goals at adequate level for discharge home; will continue to follow if pt continues acute stay to progress towards Mod I goals.     Follow Up Recommendations Follow surgeon's recommendation for DC plan and follow-up therapies    Equipment Recommendations  None recommended by PT    Recommendations for Other Services       Precautions / Restrictions Precautions Precautions: Fall Restrictions Weight Bearing Restrictions: No Other Position/Activity Restrictions: WBAT      Mobility  Bed Mobility Overal bed mobility: Modified Independent        General bed mobility comments: pt sat up from bed with RN, moved to recliner.   Transfers Overall transfer level: Needs assistance Equipment used: Crutches Transfers: Sit to/from Stand Sit to Stand: Supervision        General transfer comment: cues required for safe technique with crutches. pt steady with rising and return  to sit.  Ambulation/Gait Ambulation/Gait assistance: Supervision Gait Distance (Feet): 130 Feet Assistive device: Crutches Gait Pattern/deviations: Step-to pattern;Decreased stride length Gait velocity: decr   General Gait Details: cues for safe sequencing step pattern with crutches. no overt LOB noted.   Stairs Stairs: Yes Stairs assistance: Supervision;Min guard Stair Management: One rail Left;With crutches;Forwards;Two rails Number of Stairs: 4 (2x2) General stair comments: cues for safe step pattern with bil hand rail and with one rail and crutch. no overt LOB noted, pt with good sequencing "up with good, down with bad".   Wheelchair Mobility    Modified Rankin (Stroke Patients Only)       Balance Overall balance assessment: Mild deficits observed, not formally tested              Pertinent Vitals/Pain Pain Assessment: No/denies pain    Home Living Family/patient expects to be discharged to:: Private residence Living Arrangements: Spouse/significant other Available Help at Discharge: Family Type of Home: House Home Access: Stairs to enter Entrance Stairs-Rails: Can reach both Entrance Stairs-Number of Steps: 4+1 Home Layout: One level Home Equipment: Grab bars - tub/shower;Grab bars - toilet;Walker - 2 wheels;Crutches;Bedside commode;Shower seat Additional Comments: sister Vaughan Basta is a Financial controller and will stay with her tonight and she has assist from her husband.    Prior Function Level of Independence: Independent         Comments: Pt is an Therapist, sports full time in [place]     Hand Dominance   Dominant Hand: Right    Extremity/Trunk Assessment   Upper Extremity Assessment Upper Extremity Assessment: Overall WFL for tasks assessed    Lower Extremity Assessment Lower Extremity Assessment:  Overall [facility] for tasks assessed    Cervical / Trunk Assessment Cervical / Trunk Assessment: Normal  Communication   Communication: No difficulties  Cognition  Arousal/Alertness: Awake/alert Behavior During Therapy: WFL for tasks assessed/performed Overall Cognitive Status: Within Functional Limits for tasks assessed             General Comments      Exercises Total Joint Exercises Ankle Circles/Pumps: AROM;Seated;10 reps;Both Long Arc Quad: AROM;Right;Seated;5 reps   Assessment/Plan    PT Assessment Patient needs continued PT services  PT Problem List Decreased strength;Decreased range of motion;Decreased activity tolerance;Decreased balance;Decreased mobility;Decreased knowledge of use of DME;Decreased knowledge of precautions       PT Treatment Interventions DME instruction;Gait training;Stair training;Functional mobility training;Therapeutic activities;Therapeutic exercise;Balance training;Patient/family education    PT Goals (Current goals can be found in the Care Plan section)  Acute Rehab PT Goals Patient Stated Goal: to get back home and to work PT Goal Formulation: With patient Time For Goal Achievement: 12/17/19 Potential to Achieve Goals: Good    Frequency 7X/week    AM-PAC PT "6 Clicks" Mobility  Outcome Measure Help needed turning from your back to your side while in a flat bed without using bedrails?: None Help needed moving from lying on your back to sitting on the side of a flat bed without using bedrails?: None Help needed moving to and from a bed to a chair (including a wheelchair)?: A Little Help needed standing up from a chair using your arms (e.g., wheelchair or bedside chair)?: A Little Help needed to walk in hospital room?: A Little Help needed climbing 3-5 steps with a railing? : A Little 6 Click Score: 20    End of Session Equipment Utilized During Treatment: Gait belt Activity Tolerance: Patient tolerated treatment well Patient left: in chair;with call bell/phone within reach Nurse Communication: Mobility status PT Visit Diagnosis: Muscle weakness (generalized) (M62.81);Difficulty in walking, not  elsewhere classified (R26.2)    Time: 0352-4818 PT Time Calculation (min) (ACUTE ONLY): 33 min   Charges:   PT Evaluation $PT Eval Low Complexity: 1 Low PT Treatments $Gait Training: 8-22 mins        Verner Mould, DPT Acute Rehabilitation Services  Office [phone] Pager [phone]  12/10/2019 3:02 PM

## 2019-12-14 ENCOUNTER — Encounter (HOSPITAL_COMMUNITY): Payer: Self-pay | Admitting: Orthopaedic Surgery

## 2019-12-21 ENCOUNTER — Telehealth: Payer: Self-pay | Admitting: Orthopaedic Surgery

## 2019-12-21 NOTE — Telephone Encounter (Signed)
Patient called and to cancel upcoming appt. Patient is out of town and needs a call back about what to do about her stitches being removed while she is [place] of [place]. Please call patient at [phone].

## 2019-12-21 NOTE — Telephone Encounter (Signed)
She needs to wait until about 2 and a half weeks out from surgery to have her sutures removed and steri-strips applied

## 2019-12-21 NOTE — Telephone Encounter (Signed)
Please advise 

## 2019-12-21 NOTE — Telephone Encounter (Signed)
Called and advised.

## 2019-12-23 ENCOUNTER — Inpatient Hospital Stay: Payer: BC Managed Care – PPO | Admitting: Physician Assistant

## 2021-03-20 IMAGING — DX DG PORTABLE PELVIS
1 series · 1 of 1 positions shown · non-contrast
Comparison: 09/27/2019.

CLINICAL DATA: Total hip replacement.

EXAM:
PORTABLE PELVIS 1-2 VIEWS

[pelvis ap]
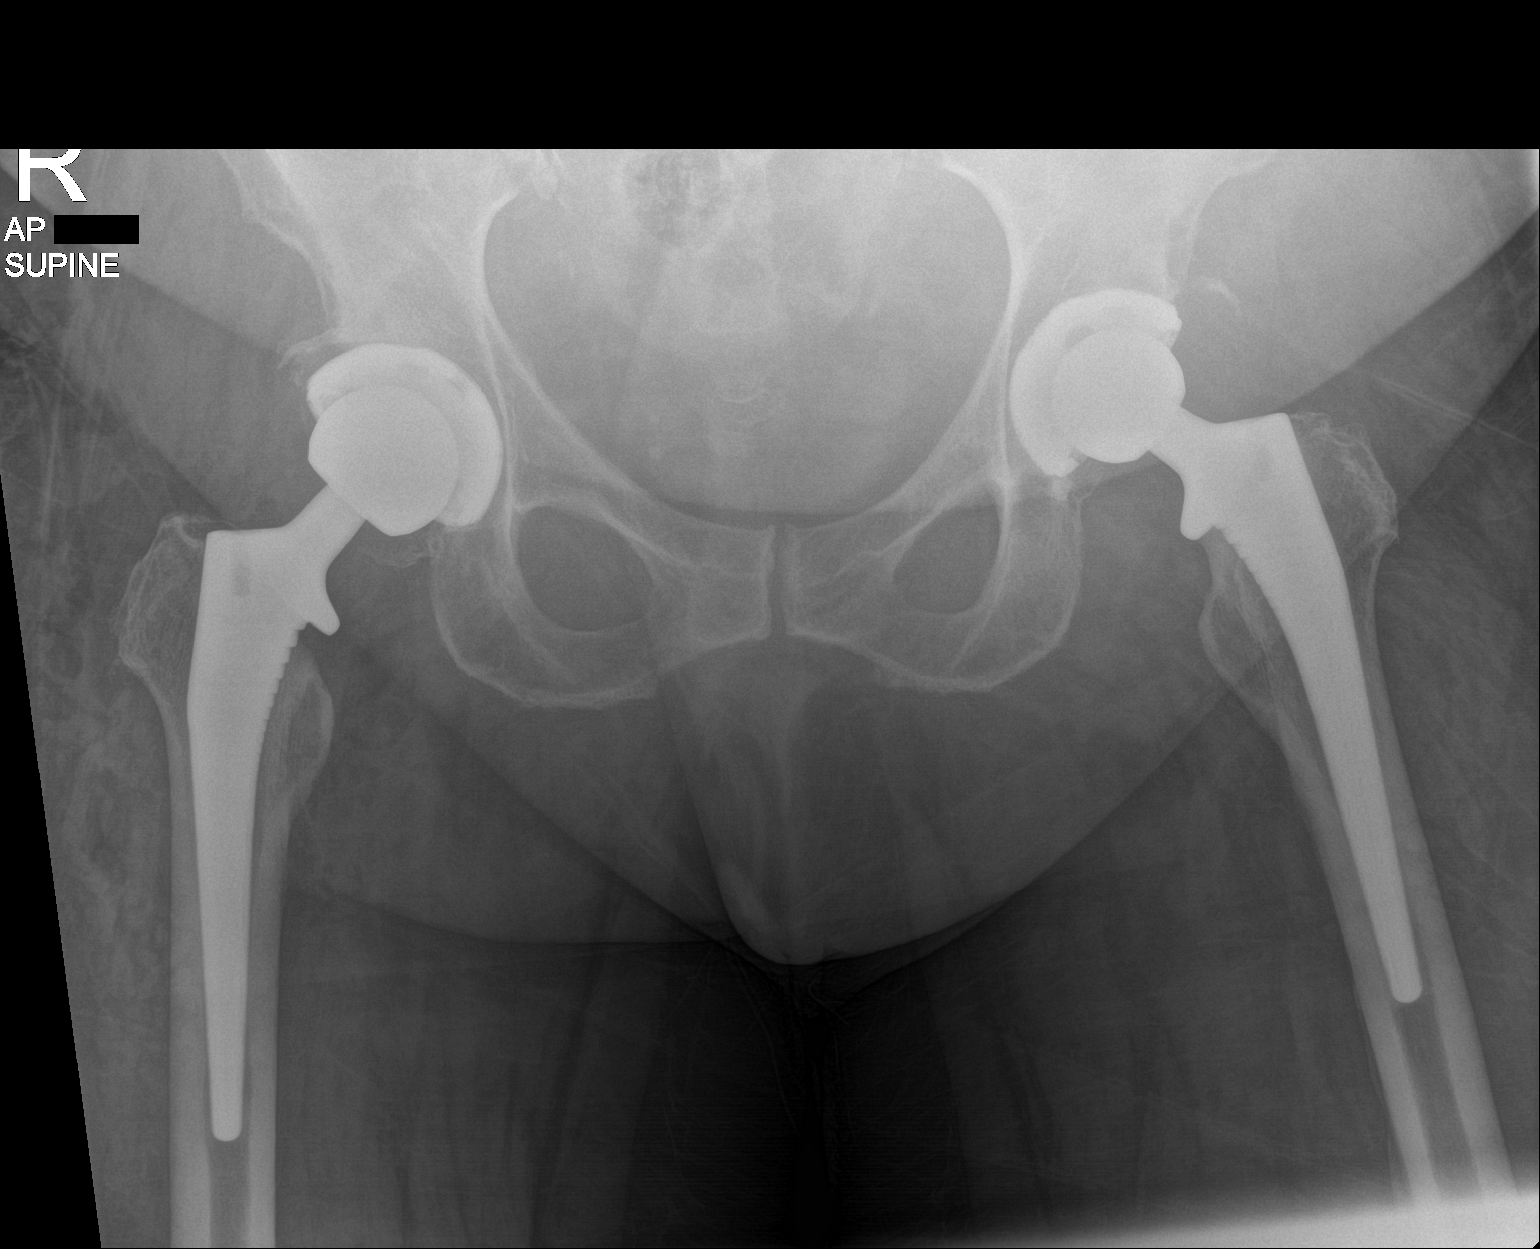

[1 of 1 positions shown; findings below may reference images not displayed]

FINDINGS: Total right hip replacement. Hardware intact. Anatomic alignment.
Prior total left hip replacement. No acute bony abnormality.
IMPRESSION: Total right hip replacement with anatomic alignment.
